# Patient Record
Sex: Female | Born: 1960 | Hispanic: No | Marital: Single | Smoking: Never smoker
Health system: Southern US, Community
[De-identification: ages and names within clinical notes are randomized; demographics above are authoritative.]

## PROBLEM LIST (undated history)

## (undated) DIAGNOSIS — K219 Gastro-esophageal reflux disease without esophagitis: Secondary | ICD-10-CM

## (undated) DIAGNOSIS — F329 Major depressive disorder, single episode, unspecified: Secondary | ICD-10-CM

## (undated) DIAGNOSIS — I10 Essential (primary) hypertension: Secondary | ICD-10-CM

## (undated) DIAGNOSIS — N289 Disorder of kidney and ureter, unspecified: Secondary | ICD-10-CM

## (undated) DIAGNOSIS — E785 Hyperlipidemia, unspecified: Secondary | ICD-10-CM

## (undated) DIAGNOSIS — N200 Calculus of kidney: Secondary | ICD-10-CM

## (undated) DIAGNOSIS — F32A Depression, unspecified: Secondary | ICD-10-CM

## (undated) DIAGNOSIS — F419 Anxiety disorder, unspecified: Secondary | ICD-10-CM

## (undated) DIAGNOSIS — F840 Autistic disorder: Secondary | ICD-10-CM

## (undated) DIAGNOSIS — R011 Cardiac murmur, unspecified: Secondary | ICD-10-CM

## (undated) HISTORY — DX: Essential (primary) hypertension: I10

## (undated) HISTORY — DX: Calculus of kidney: N20.0

## (undated) HISTORY — PX: NO PAST SURGERIES: SHX2092

## (undated) HISTORY — DX: Major depressive disorder, single episode, unspecified: F32.9

## (undated) HISTORY — DX: Anxiety disorder, unspecified: F41.9

## (undated) HISTORY — DX: Depression, unspecified: F32.A

## (undated) HISTORY — DX: Autistic disorder: F84.0

## (undated) HISTORY — DX: Hyperlipidemia, unspecified: E78.5

## (undated) HISTORY — DX: Cardiac murmur, unspecified: R01.1

## (undated) HISTORY — DX: Gastro-esophageal reflux disease without esophagitis: K21.9

---

## 2010-10-02 ENCOUNTER — Emergency Department (HOSPITAL_COMMUNITY)
Admission: EM | Admit: 2010-10-02 | Discharge: 2010-10-02 | Disposition: A | Discharge status: Home / Self Care | Payer: Managed Care, Other (non HMO) | Attending: Emergency Medicine | Admitting: Emergency Medicine

## 2010-10-02 ENCOUNTER — Emergency Department (HOSPITAL_COMMUNITY): Payer: Managed Care, Other (non HMO)

## 2010-10-02 DIAGNOSIS — R509 Fever, unspecified: Secondary | ICD-10-CM | POA: Insufficient documentation

## 2010-10-02 DIAGNOSIS — R0989 Other specified symptoms and signs involving the circulatory and respiratory systems: Secondary | ICD-10-CM | POA: Insufficient documentation

## 2010-10-02 DIAGNOSIS — R109 Unspecified abdominal pain: Secondary | ICD-10-CM | POA: Insufficient documentation

## 2010-10-02 DIAGNOSIS — R51 Headache: Secondary | ICD-10-CM | POA: Insufficient documentation

## 2010-10-02 DIAGNOSIS — IMO0001 Reserved for inherently not codable concepts without codable children: Secondary | ICD-10-CM | POA: Insufficient documentation

## 2010-10-02 DIAGNOSIS — R059 Cough, unspecified: Secondary | ICD-10-CM | POA: Insufficient documentation

## 2010-10-02 DIAGNOSIS — R110 Nausea: Secondary | ICD-10-CM | POA: Insufficient documentation

## 2010-10-02 DIAGNOSIS — R05 Cough: Secondary | ICD-10-CM | POA: Insufficient documentation

## 2010-10-02 DIAGNOSIS — R5381 Other malaise: Secondary | ICD-10-CM | POA: Insufficient documentation

## 2010-10-02 LAB — URINALYSIS, ROUTINE W REFLEX MICROSCOPIC
Glucose, UA: NEGATIVE mg/dL
Leukocytes, UA: NEGATIVE
Protein, ur: NEGATIVE mg/dL
pH: 6 (ref 5.0–8.0)

## 2010-10-02 LAB — COMPREHENSIVE METABOLIC PANEL
BUN: 14 mg/dL (ref 6–23)
Calcium: 9.5 mg/dL (ref 8.4–10.5)
Creatinine, Ser: 0.66 mg/dL (ref 0.4–1.2)
Glucose, Bld: 76 mg/dL (ref 70–99)
Total Protein: 6.7 g/dL (ref 6.0–8.3)

## 2010-10-02 LAB — POCT PREGNANCY, URINE: Preg Test, Ur: NEGATIVE

## 2010-10-02 LAB — DIFFERENTIAL
Basophils Relative: 1 % (ref 0–1)
Eosinophils Absolute: 0.1 10*3/uL (ref 0.0–0.7)
Lymphs Abs: 2 10*3/uL (ref 0.7–4.0)
Neutro Abs: 5.3 10*3/uL (ref 1.7–7.7)
Neutrophils Relative %: 67 % (ref 43–77)

## 2010-10-02 LAB — CBC
MCV: 88 fL (ref 78.0–100.0)
Platelets: 170 10*3/uL (ref 150–400)
RBC: 4.65 MIL/uL (ref 3.87–5.11)
WBC: 8 10*3/uL (ref 4.0–10.5)

## 2010-10-02 LAB — URINE MICROSCOPIC-ADD ON

## 2010-10-02 LAB — CK TOTAL AND CKMB (NOT AT ARMC)
CK, MB: 1.2 ng/mL (ref 0.3–4.0)
Total CK: 55 U/L (ref 7–177)

## 2010-10-24 ENCOUNTER — Inpatient Hospital Stay (HOSPITAL_COMMUNITY)
Admission: EM | Admit: 2010-10-24 | Discharge: 2010-10-26 | DRG: 918 | Disposition: A | Discharge status: Other Acute Inpatient Hospital | Payer: Managed Care, Other (non HMO) | Attending: Internal Medicine | Admitting: Internal Medicine

## 2010-10-24 ENCOUNTER — Emergency Department (HOSPITAL_COMMUNITY): Payer: Managed Care, Other (non HMO)

## 2010-10-24 DIAGNOSIS — E876 Hypokalemia: Secondary | ICD-10-CM | POA: Diagnosis present

## 2010-10-24 DIAGNOSIS — F339 Major depressive disorder, recurrent, unspecified: Secondary | ICD-10-CM | POA: Diagnosis present

## 2010-10-24 DIAGNOSIS — T50992A Poisoning by other drugs, medicaments and biological substances, intentional self-harm, initial encounter: Secondary | ICD-10-CM | POA: Diagnosis present

## 2010-10-24 DIAGNOSIS — R569 Unspecified convulsions: Secondary | ICD-10-CM | POA: Diagnosis present

## 2010-10-24 DIAGNOSIS — F411 Generalized anxiety disorder: Secondary | ICD-10-CM | POA: Diagnosis present

## 2010-10-24 DIAGNOSIS — T450X4A Poisoning by antiallergic and antiemetic drugs, undetermined, initial encounter: Principal | ICD-10-CM | POA: Diagnosis present

## 2010-10-24 DIAGNOSIS — Z818 Family history of other mental and behavioral disorders: Secondary | ICD-10-CM

## 2010-10-24 DIAGNOSIS — R42 Dizziness and giddiness: Secondary | ICD-10-CM | POA: Diagnosis present

## 2010-10-24 DIAGNOSIS — T394X2A Poisoning by antirheumatics, not elsewhere classified, intentional self-harm, initial encounter: Secondary | ICD-10-CM | POA: Diagnosis present

## 2010-10-24 DIAGNOSIS — T39314A Poisoning by propionic acid derivatives, undetermined, initial encounter: Secondary | ICD-10-CM | POA: Diagnosis present

## 2010-10-24 DIAGNOSIS — R000 Tachycardia, unspecified: Secondary | ICD-10-CM | POA: Diagnosis present

## 2010-10-24 LAB — DIFFERENTIAL
Basophils Relative: 1 % (ref 0–1)
Lymphs Abs: 1.8 10*3/uL (ref 0.7–4.0)
Monocytes Absolute: 0.5 10*3/uL (ref 0.1–1.0)
Monocytes Relative: 6 % (ref 3–12)
Neutro Abs: 5.3 10*3/uL (ref 1.7–7.7)

## 2010-10-24 LAB — CBC
HCT: 39.2 % (ref 36.0–46.0)
Hemoglobin: 13.8 g/dL (ref 12.0–15.0)
MCH: 30.9 pg (ref 26.0–34.0)
MCHC: 35.2 g/dL (ref 30.0–36.0)
MCV: 87.7 fL (ref 78.0–100.0)

## 2010-10-24 LAB — URINALYSIS, ROUTINE W REFLEX MICROSCOPIC
Bilirubin Urine: NEGATIVE
Hgb urine dipstick: NEGATIVE
Specific Gravity, Urine: 1.02 (ref 1.005–1.030)
Urobilinogen, UA: 0.2 mg/dL (ref 0.0–1.0)

## 2010-10-25 ENCOUNTER — Inpatient Hospital Stay (HOSPITAL_COMMUNITY): Payer: Managed Care, Other (non HMO)

## 2010-10-25 LAB — TSH: TSH: 2.61 u[IU]/mL (ref 0.350–4.500)

## 2010-10-25 LAB — COMPREHENSIVE METABOLIC PANEL
BUN: 12 mg/dL (ref 6–23)
CO2: 21 mEq/L (ref 19–32)
Chloride: 102 mEq/L (ref 96–112)
Creatinine, Ser: 0.82 mg/dL (ref 0.50–1.10)
GFR calc Af Amer: >60 mL/min (ref >60)
GFR calc non Af Amer: >60 mL/min (ref >60)
Glucose, Bld: 138 mg/dL — ABNORMAL HIGH (ref 70–99)
Total Bilirubin: 0.5 mg/dL (ref 0.3–1.2)

## 2010-10-25 LAB — BASIC METABOLIC PANEL
CO2: 20 mEq/L (ref 19–32)
Calcium: 9.3 mg/dL (ref 8.4–10.5)
Chloride: 106 mEq/L (ref 96–112)
GFR calc Af Amer: >60 mL/min (ref >60)
Sodium: 141 mEq/L (ref 135–145)

## 2010-10-25 LAB — IRON AND TIBC
Iron: 129 ug/dL (ref 42–135)
Saturation Ratios: 37 % (ref 20–55)
TIBC: 347 ug/dL (ref 250–470)
UIBC: 218 ug/dL

## 2010-10-25 LAB — MRSA PCR SCREENING: MRSA by PCR: NEGATIVE

## 2010-10-25 LAB — AMMONIA: Ammonia: 12 umol/L (ref 11–60)

## 2010-10-25 LAB — PROLACTIN: Prolactin: 5.6 ng/mL

## 2010-10-25 LAB — SALICYLATE LEVEL: Salicylate Lvl: <2 mg/dL — ABNORMAL LOW (ref 2.8–20.0)

## 2010-10-25 LAB — ETHANOL: Alcohol, Ethyl (B): <11 mg/dL (ref 0–11)

## 2010-10-25 LAB — RAPID URINE DRUG SCREEN, HOSP PERFORMED: Barbiturates: NOT DETECTED

## 2010-10-25 LAB — MAGNESIUM: Magnesium: 2.1 mg/dL (ref 1.5–2.5)

## 2010-10-26 ENCOUNTER — Inpatient Hospital Stay (HOSPITAL_COMMUNITY)
Admission: AD | Admit: 2010-10-26 | Discharge: 2010-10-30 | DRG: 885 | Disposition: A | Discharge status: Home / Self Care | Payer: Managed Care, Other (non HMO) | Source: Ambulatory Visit | Attending: Psychiatry | Admitting: Psychiatry

## 2010-10-26 DIAGNOSIS — F331 Major depressive disorder, recurrent, moderate: Secondary | ICD-10-CM

## 2010-10-26 DIAGNOSIS — T50992A Poisoning by other drugs, medicaments and biological substances, intentional self-harm, initial encounter: Secondary | ICD-10-CM

## 2010-10-26 DIAGNOSIS — R42 Dizziness and giddiness: Secondary | ICD-10-CM

## 2010-10-26 DIAGNOSIS — T450X4A Poisoning by antiallergic and antiemetic drugs, undetermined, initial encounter: Secondary | ICD-10-CM

## 2010-10-26 DIAGNOSIS — F411 Generalized anxiety disorder: Secondary | ICD-10-CM

## 2010-10-26 NOTE — Procedures (Signed)
EEG NUMBER:  REFERRING PHYSICIAN:  Dr. Thedore Mins.  HISTORY:  A 50 year old female with possible seizures and altered mental status.  MEDICATIONS:  Potassium, Ativan, meclizine.  CONDITIONS OF RECORDING:  This is a 16-channel EEG carried out patient in the awake state.  DESCRIPTION:  The waking background activity consists of a low-voltage symmetrical fairly well-organized 9-10 Hz alpha activity seen from the parieto-occipital and posterotemporal regions.  Low-voltage fast activity poorly organized was seen anteriorly at times, superimposed on more posterior rhythms.  A mixture of theta and alpha rhythms was seen from these central and temporal regions.  The patient does not drowse or sleep.  Hyperventilation was not performed.  Intermittent photic stimulation failed to elicit any change in the tracing.  IMPRESSION:  This is a normal awake EEG.  COMMENT:  An EEG, the patient is sleep deprived to elicit drowsing like sleep may be desirable to further elicit possible seizure disorder.    ______________________________ Thana Farr, MD     Dr. Singh    JY:NWGN D:  10/25/2010 20:01:43  T:  10/26/2010 56:21:30  Job #:  [id number]

## 2010-10-27 DIAGNOSIS — F339 Major depressive disorder, recurrent, unspecified: Secondary | ICD-10-CM

## 2010-10-27 DIAGNOSIS — F411 Generalized anxiety disorder: Secondary | ICD-10-CM

## 2010-10-28 NOTE — H&P (Signed)
[name], BRASINGTON                ACCOUNT NO.:  [id number]  MEDICAL RECORD NO.:  [id number]  LOCATION:                                FACILITY:  [facility]  PHYSICIAN:  Franchot Gallo, MD     DATE OF BIRTH:  [birth year]  DATE OF ADMISSION:  10/26/2010 DATE OF DISCHARGE:                      PSYCHIATRIC ADMISSION ASSESSMENT   CHIEF COMPLAINT:  "I tried to kill myself with over-the-counter sleep medications."  HISTORY OF PRESENT ILLNESS:  The patient is a 50 year old divorced white female who was admitted to [facility] on October 26, 2010 after overdosing on over-the-counter sleep medications.  The patient states that she has been feeling depressed and suicidal for the last 3-6 months and after a particularly hard day at work she purchased the over-the- counter medications with plan to overdose.  She reports that she overdosed on the medication and then was taken to [facility] for treatment and later transferred to [facility].  The patient states she has had depressive symptoms for many years and first attempted to harm herself when she was 50 years of age again by overdosing on medications.  The patient reports that she has difficulty with "meeting people" at work as well as a very critical mother.  She reports that she recently relocated back to [place] from [place] where the patient resided for 9 years.  She states that while in [place], she felt her depressive symptoms were under better control. Since returning to [place] 1 year ago, she reports increasing depressive symptoms.  Currently, the patient reports sleeping well without difficulty but reports a decreased appetite.  She describes her depression as severe and states that on a scale of 1-10 she would rate it a 10.  She reports severe feelings of sadness, anhedonia and depressed mood that again has been present for the last 6 months.  She currently denies any suicidal ideations with plan or intent,  but states that she continues to have some vague suicidal ideations.  The patient denies any past or current auditory or visual hallucinations or delusional thinking.  She also denies any past or current manic or hypomanic symptoms. The patient does state that she has significant anxiety throughout the day and describes it as "fear of the unknown."  She denies any panic episodes.  She also denies any substance abuse related issues.  The patient is admitted for evaluation and treatment of the above symptoms.  PAST PSYCHIATRIC HISTORY:  The patient states that she has one past psychiatric hospitalization to [facility] approximately 20 years ago.  She states that she has been on numerous antidepressive medications including Prozac, Zoloft, Paxil, Effexor, Wellbutrin SR, Lexapro and Cymbalta as well as Depakote, Lamictal and Abilify.  She denies any past use of Celexa, but states that all antidepressants in the past have "made me a zombie."  Currently, she is not under any psychiatric care.  PAST MEDICAL HISTORY:  CURRENT MEDICATIONS:   1. Zyrtec 10 mg p.o. daily.  ALLERGIES:   1. Sulfa medications, results in nausea and vomiting and GI upset.  MEDICAL HISTORY: 1. Seasonal allergies. 2. Vertigo.  PAST OPERATIONS:  None reported.  FAMILY HISTORY:  The patient's mother reportedly  has a history of depression and anxiety.  She denies any other family history of psychiatric or substance related illnesses.  SOCIAL HISTORY:  The patient was born and raised in [place], [place] and recently relocated back to [place] after living in [place] for 9 years.  The patient is divorced after 7 years of marriage with a divorce ending in 2006.  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  She reports that prior to returning to [place], she worked at [employer] of [place] for 9 years.  The patient currently lives with her parents in [place].  She denies any use of tobacco products, alcohol or illicit drugs.  MENTAL STATUS EXAM:  General:  The patient was alert and oriented x3 and was cooperative with this provider, but somewhat guarded in her answers. The patient had some religious overtime such as asked if she had ever been diagnosed with OCD.  She stated "my bible and my lord never called me OCD."  Speech was appropriate in terms of rate and volume.  Mood appeared moderate to severely depressed.  Affect was moderate to severely constricted.  Thoughts:  The patient denied any auditory or visual hallucinations or delusional thinking.  She reported some vague suicidal ideations but no plan or intent.  She denied any homicidal ideations.  Judgment and insight both appeared fair.  IMPRESSION:   AXIS I: 1. Major depressive disorder, recurrent, severe. 2. Generalized anxiety disorder, prep currently under poor control. AXIS II:  Deferred. AXIS III:  Please see medical history above. AXIS IV:  Difficulty with primary support system.  Occupational difficulties.  Longstanding chronic mental illness. AXIS V:  GAF at time of admission approximately 40.  Highest GAF in the past year approximately 60.  PLAN: 1. After much discussion, the patient was agreeable to starting the     medication Celexa at 20 mg p.o. q.a.m. to begin to address her     depressive and anxiety symptoms. 2. Efforts will be made to continue to discuss medications with the     patient in order to address her symptoms. 3. The patient will continue to be monitored for dangerousness to self     and/or others. 4. The patient will participate in group and unit activities per     routine.    _________________________________ Franchot Gallo, MD     RR/MEDQ  D:  10/27/2010  T:  10/28/2010  Job:  [id number]  Electronically Signed by Franchot Gallo MD on 10/28/2010  05:34:17 PM

## 2010-10-30 DIAGNOSIS — F411 Generalized anxiety disorder: Secondary | ICD-10-CM

## 2010-10-30 DIAGNOSIS — F339 Major depressive disorder, recurrent, unspecified: Secondary | ICD-10-CM

## 2010-11-02 NOTE — Discharge Summary (Signed)
[name], [name] NO.:  [id number]  MEDICAL RECORD NO.:  [id number]  LOCATION:  0502                          FACILITY:  [facility]  PHYSICIAN:  Franchot Gallo, MD     DATE OF BIRTH:  [birth year]  DATE OF ADMISSION:  10/26/2010 DATE OF DISCHARGE:  10/30/2010                              DISCHARGE SUMMARY   [name] is a 50 year old divorced white female who was admitted to [facility] on October 26, 2010, after overdosing on over-the-counter medications.  The patient stated that she was feeling depressed and suicidal for at least 3-6 months prior to admission secondary to work- related stresses.  The patient stated on a particularly day at work, she purchased over-the-counter medications with plans to overdose.  After taking the medications, she was taken to [facility] treatment and later transferred to [facility].  She was seen by this physician for the initial evaluation on October 28, 2010.  SUMMARY OF HOSPITALIZATION:  As stated above, the patient was first seen by this physician on October 27, 2010, and reported that she was sleeping well but reported a decreased appetite.  She, however, reported severe feelings of sadness, anhedonia and depressed mood and rated her depression on a scale of 1-10 as a 10.  She denied any suicidal or homicidal ideations.  She also denied any auditory or visual hallucinations or delusional thinking.  She rated her anxiety as severe and denied any substance abuse-related issues.  The patient was started on the medication Celexa at 20 mg p.o. q.a.m. to address her depressive and anxiety symptoms.  The patient, however, was reluctant to start medications but did agree to this medication.  The patient was next seen by the on-call staff on October 28, 2010, and rated her depression on a scale of 1-10 as a 6.  She stated she slept okay.  She denied suicidal or homicidal ideations.  The patient was next seen by the  on-call treatment team on October 29, 2010, and stated that she did not feel that there were enough structural activities at [facility].  She also stated that she was planning to return to work the following Monday.  She reported she was tolerating the medication Celexa and denied any suicidal or homicidal ideations as well as any auditory or visual hallucinations or delusional thinking.  The patient was next seen by the on-call team on October 30, 2010, and stated she was ready to leave.  She stated she did not want one more meal here, and that food was "going right through her."  She denied any auditory or visual hallucinations or delusional thinking and also denied any suicidal or homicidal ideations.  The patient was seen by Dr. Geoffery Lyons, who was the on-call provider, and was assessed for suicide and was found to be suitable for discharge.  The patient was discharged on October 30, 2010.  SIGNIFICANT LABORATORIES:  The patient, on October 24, 2010, was found to have a potassium of 3.2.  Repeat of this on October 25, 2010, showed a potassium of 3.8.  DISCHARGE MEDICATIONS: 1. Celexa 20 mg p.o. q.a.m. 2. Zyrtec 10 mg p.o. q.h.s.  DISCHARGE  DIAGNOSES:  Axis I: 1. Major depressive disorder - Recurrent - Moderate. 2. Generalized anxiety disorder - Under fair to good control. Axis II:  Deferred. Axis III: 1. SEASONAL ALLERGIES. 2. Vertigo. Axis IV:  Difficulty with primary support system.  Occupational difficulties.  Longstanding chronic mental illness. Axis V:  Global Assessment of Functioning at time of admission approximately 40.  Estimated Global Assessment of Functioning at time of discharge approximately 65.  (This is an estimate, since this provider was not present at the time of the patient's discharge.)  CONDITION AT DISCHARGE:  According to the suicide assessment completed by Dr. Geoffery Lyons at the time of discharge, the patient was endorsing no active suicidal ideations, no  plans and no intent.  She will be taking her medication, and she will not isolate.  She plans to go to church.  FOLLOWUP INSTRUCTIONS:  The patient will follow up with [name], a nurse practitioner at [facility] on November 03, 2010, at 2:00 p.m.    __________________________________ Franchot Gallo, MD     RR/MEDQ  D:  11/01/2010  T:  11/01/2010  Job:  [id number]  Electronically Signed by Franchot Gallo MD on 11/02/2010 07:27:17 AM
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
## 2010-11-04 NOTE — Consult Note (Signed)
  [name], [name]                ACCOUNT NO.:  [id number]  MEDICAL RECORD NO.:  [id number]  LOCATION:  5523                         FACILITY:  [facility]  PHYSICIAN:  Eulogio Ditch, MD DATE OF BIRTH:  April 21, 1961  DATE OF CONSULTATION: DATE OF DISCHARGE:                                CONSULTATION   REASON FOR CONSULTATION:  Depression/overdose suicide attempt.  HISTORY OF PRESENT ILLNESS:  A 50 year old female who was admitted on the medical floor after the overdosed Advil approximately 30 pills to kill herself.  The patient reported that it all started at the work. 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  Her mother also hates her and that is why she was thinking of killing herself for many days to weeks and then yesterday she acted on that.  The patient has still reported depressed mood and have suicidal ideations.  The patient reported that mother hate her in the work place that is why she is becoming more depressed "one person can take so much."  The patient reported that her sleep is fair, but appetite comes and goes.  She denied hearing any voices or any paranoid behavior.  The patient was divorced in 2006.  She was married for 7 years.  She reported that they were not getting along, so that is why she was divorced.  The patient has no kids.  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  PAST PSYCHIATRIC HISTORY:  The patient has a history of suicide attempt at the age of 42.  She was admitted in the hospital, but she did not any counseling or therapy after that and then around the age of 11, she was started on number of medications for depression, but she reported that none of them has worked for her, but she is not taking any medication.  PAST MEDICAL HISTORY:  No active medical issue.  ALLERGIES:  NONE.  MENTAL STATUS EXAMINATION:  The patient is calm and cooperative  during interview.  Fair eye contact.  Speech is soft and slow.  No psychomotor agitation or retardation noted.  Mood depressed.  Affect constricted, still have suicidal ideations, recent suicide attempt, not delusional, not internally preoccupied, and not hallucinating.  Cognition alert, awake, and oriented x3.  Memory immediate and recent remote fair. Attention and concentration fair.  Abstraction ability fair.  Insight and judgment poor.  DIAGNOSES:  Axis I:  Major depressive disorder, recurrent type; anxiety disorder, not otherwise specified. Axis II:  Deferred. Axis III:  See medical notes. Axis IV:  Stress both at home and at job. Axis V:  30.  RECOMMENDATIONS:  Once the patient is medically cleared, the patient will be transferred to [facility] for further stabilization.  No medication started at this time, sitter can be continued.     Eulogio Ditch, MD     SA/MEDQ  D:  10/26/2010  T:  10/26/2010  Job:  [id number]  Electronically Signed by Eulogio Ditch  on 11/04/2010 07:31:22 AM

## 2010-12-13 NOTE — Discharge Summary (Signed)
[name], Turner NO.:  [id number]  MEDICAL RECORD NO.:  [id number]  LOCATION:  5523                         FACILITY:  [facility]  PHYSICIAN:  Altha Harm, MDDATE OF BIRTH:  1961-04-04  DATE OF ADMISSION:  10/24/2010 DATE OF DISCHARGE:  10/26/2010                        DISCHARGE SUMMARY - REFERRING   PRIMARY CARE PHYSICIAN:  Deatra James, MD of [facility] at [address]  DISCHARGE DIAGNOSES: 1. Suicide attempt, sleeping pill overdose. 2. History of depression, anxiety. 3. Question of tonic-clonic seizure episode en route to the hospital. 4. Hypokalemia. 5. Vestibular nerve hypofunction causing vertigo.  CONDITION AT THE TIME OF DISCHARGE:  The patient is alert and oriented. She is calm and pleasant.  She is not looking forward to going to [facility], but willing to do so if it improves her life. Otherwise, the patient is eating well and drinking well, able to ambulate and take care for all of her own ADLs.  HISTORY AND BRIEF HOSPITAL COURSE:  [name] is an extremely pleasant 50 year old female who has lived with her mother for the past year and a half after stressful divorce.  The patient tells me that at work she was having a difficult situation with her coworkers and that they bullied her as did her employer.  Also, she was having difficulty with her mother.  The patient consumed a bottle of [employer] generic sleeping pills and there were 32 in the bottle in an attempt to end her life.  She was taken to the emergency department where she was found to have very dry mouth, tachycardia, and hypertension.  There was a question of the patient having a tonic-clonic seizure in the ambulance on the way to the hospital.  Her initial EKG, CT of her brain and labs were unremarkable.  Urine drug screen was negative.  Acetaminophen and salicylate levels were negative.  EEG was done on October 25, 2010, and was found to be a normal awake  EEG.  The patient was able to be transferred from the [facility] Unit to the floor today.  She is doing well.  She has been seen by Psychiatry who feels that she would benefit from an inpatient hospital stay.  Consequently, psychiatric social work was able to obtain a bed for her at [facility] and she will be transferred this afternoon.  PHYSICAL EXAMINATION:  GENERAL:  At the time of discharge, the patient is awake, alert in no apparent distress.  As mentioned, she is not particularly happy about going to the [facility], but is willing to do so. VITAL SIGNS:  Temperature 98.8, pulse 64, respirations 18, blood pressure 121/75, O2 sats 95% on room air. HEENT:  Head is atraumatic, normocephalic.  Eyes are anicteric with pupils are equal, round, reactive to light.  Nose shows no nasal discharge or exterior lesions.  Mouth has moist mucous membranes and good dentition. NECK:  Supple, midline trachea.  No JVD.  No lymphadenopathy. CHEST:  Demonstrates no accessory muscle use.  She has no wheezes or crackles to my exam. HEART:  Regular rate and rhythm.  No obvious murmurs, rubs, or gallops. ABDOMEN:  Soft, nontender, nondistended with  good bowel sounds. EXTREMITIES:  Show no clubbing, cyanosis, or edema. SKIN:  Shows no rashes, bruises, or lesions. NEURO:  Cranial nerves II through XII are grossly intact.  She has no facial asymmetries.  No obvious focal neuro deficits.    On exam and working with physical therapy, the patient appears to have a hypofunction of her vestibular nerve.  The patient is unable to move her head to the left or right and yet maintain to focus on a stationary object.  Physical therapy as working with her now regarding this.  LABORATORY DATA:  CBC, October 24, 2010, white count 7.6, hemoglobin 13.8, hematocrit 39.2, platelets 153.  BMET on October 25, 2010, sodium 141, potassium 3.8, chloride 106, bicarb 20, glucose 79, BUN 7, creatinine 0.64,  calcium 9.3, magnesium 2.1, she had a TSH 2.610.  Iron 129, TIBC 347%, sat 37%, UIBC 218, vitamin B12 of 305.  Folate 10.2, ferritin 22. Urine pregnancy was negative.  Serum prolactin was 5.6.  Acetaminophen level less than 15.  Salicylate level less than 2.  Drug screen was negative.  Alcohol level less than 11.  Urinary analysis was negative. Nasal swab for MRSA by PCR was negative.  Radiological exams include a CT of her head without contrast that was negative.  A normal study and a two-view of her chest that was negative, normal study.  DISCHARGE MEDICATIONS: 1. Meclizine 12.5 mg by mouth every 6 hours as needed for dizziness or     vertigo. 2. Zofran 4 mg 1 tablet by mouth every 6 hours as needed for nausea.  DISCHARGE INSTRUCTIONS:  The patient will be leaving [facility] for [facility].  Activity will be as tolerated.  Diet will be regular diet.  FOLLOWUP INSTRUCTIONS:  The patient should see Dr. Deatra James in 1 to 2 weeks after discharge from [facility] to follow up on her hypokalemia, her vestibular nerve hypofunction and her depression and anxiety as necessary.   Stephani Police, PA   ______________________________ Altha Harm, MD    MLY/MEDQ  D:  10/26/2010  T:  10/26/2010  Job:  [id number]  cc:   Deatra James, M.D.  Electronically Signed by Algis Downs PA on 11/15/2010 08:34:15 PM Electronically Signed by Marthann Schiller MD on 12/13/2010 08:41:09 PM

## 2012-08-23 ENCOUNTER — Emergency Department (HOSPITAL_COMMUNITY): Payer: Self-pay

## 2012-08-23 ENCOUNTER — Encounter (HOSPITAL_COMMUNITY): Payer: Self-pay | Admitting: Emergency Medicine

## 2012-08-23 ENCOUNTER — Emergency Department (HOSPITAL_COMMUNITY)
Admission: EM | Admit: 2012-08-23 | Discharge: 2012-08-23 | Disposition: A | Discharge status: Home / Self Care | Payer: Self-pay | Attending: Emergency Medicine | Admitting: Emergency Medicine

## 2012-08-23 DIAGNOSIS — R110 Nausea: Secondary | ICD-10-CM | POA: Insufficient documentation

## 2012-08-23 DIAGNOSIS — Z87448 Personal history of other diseases of urinary system: Secondary | ICD-10-CM | POA: Insufficient documentation

## 2012-08-23 DIAGNOSIS — N201 Calculus of ureter: Secondary | ICD-10-CM | POA: Insufficient documentation

## 2012-08-23 DIAGNOSIS — N200 Calculus of kidney: Secondary | ICD-10-CM | POA: Insufficient documentation

## 2012-08-23 DIAGNOSIS — R6883 Chills (without fever): Secondary | ICD-10-CM | POA: Insufficient documentation

## 2012-08-23 HISTORY — DX: Disorder of kidney and ureter, unspecified: N28.9

## 2012-08-23 LAB — URINALYSIS, MICROSCOPIC ONLY
Glucose, UA: NEGATIVE mg/dL
Ketones, ur: NEGATIVE mg/dL
pH: 6 (ref 5.0–8.0)

## 2012-08-23 MED ORDER — TAMSULOSIN HCL 0.4 MG PO CAPS: 0.4000 mg | ORAL_CAPSULE | Freq: Every day | ORAL | Status: DC

## 2012-08-23 MED ORDER — ONDANSETRON 4 MG PO TBDP
4.0000 mg | ORAL_TABLET | Freq: Once | ORAL | Status: AC
  Administered 2012-08-23: 4 mg via ORAL
  Filled 2012-08-23: qty 1

## 2012-08-23 MED ORDER — OXYCODONE-ACETAMINOPHEN 5-325 MG PO TABS
2.0000 | ORAL_TABLET | Freq: Once | ORAL | Status: AC
  Administered 2012-08-23: 2 via ORAL
  Filled 2012-08-23: qty 2

## 2012-08-23 MED ORDER — ONDANSETRON HCL 4 MG/2ML IJ SOLN
4.0000 mg | Freq: Once | INTRAMUSCULAR | Status: AC
  Administered 2012-08-23: 4 mg via INTRAVENOUS
  Filled 2012-08-23: qty 2

## 2012-08-23 MED ORDER — OXYCODONE-ACETAMINOPHEN 5-325 MG PO TABS: 1.0000 | ORAL_TABLET | Freq: Four times a day (QID) | ORAL | Status: DC | PRN

## 2012-08-23 MED ORDER — ONDANSETRON HCL 4 MG PO TABS: 4.0000 mg | ORAL_TABLET | Freq: Four times a day (QID) | ORAL | Status: DC

## 2012-08-23 NOTE — ED Provider Notes (Signed)
History     CSN: [id number]  Arrival date & time 08/23/12  1810   First MD Initiated Contact with Patient 08/23/12 1829      Chief Complaint  Patient presents with  . Flank Pain    (Consider location/radiation/quality/duration/timing/severity/associated sxs/prior treatment) HPI Comments: 52 year old female with past medical history of kidney stones presents to the emergency department complaining of sudden onset left-sided flank pain beginning 3 hours ago after urinating. Initial pain described as sharp and severe, rated 10 out of 10, however it is been waxing and waning since and currently rated 6/10. She try taking Aleve without much relief. Admits to associated chills and nausea without vomiting or fever. Denies difficulty urinating, increased urinary frequency, urgency, dysuria or hematuria. Last kidney stone was over 15 years ago. Denies abdominal pain, chest pain, sob.  Patient is a 52 y.o. female presenting with flank pain. The history is provided by the patient.  Flank Pain Associated symptoms include chills and nausea. Pertinent negatives include no abdominal pain, fever or vomiting.    Past Medical History  Diagnosis Date  . Renal disorder     History reviewed. No pertinent past surgical history.  No family history on file.  History  Substance Use Topics  . Smoking status: Never Smoker   . Smokeless tobacco: Not on file  . Alcohol Use: No    OB History   Grav Para Term Preterm Abortions TAB SAB Ect Mult Living                  Review of Systems  Constitutional: Positive for chills. Negative for fever.  Gastrointestinal: Positive for nausea. Negative for vomiting and abdominal pain.  Genitourinary: Positive for flank pain. Negative for dysuria, urgency, frequency, hematuria and difficulty urinating.  All other systems reviewed and are negative.    Allergies  Sodium feredetate and Sulfur  Home Medications   Current Outpatient Rx  Name  Route  Sig   Dispense  Refill  . acetaminophen (TYLENOL) 500 MG tablet   Oral   Take 500 mg by mouth every 6 (six) hours as needed for pain.         . naproxen sodium (ANAPROX) 220 MG tablet   Oral   Take 220 mg by mouth daily as needed (as needed for pain).         . vitamin C (ASCORBIC ACID) 500 MG tablet   Oral   Take 500 mg by mouth daily.           BP 157/74  Pulse 53  Temp(Src) 98.7 F (37.1 C) (Oral)  Resp 18  SpO2 100%  Physical Exam  Nursing note and vitals reviewed. Constitutional: She is oriented to person, place, and time. She appears well-developed and well-nourished.  Standing in exam room, uncomfortable sitting.  HENT:  Head: Normocephalic and atraumatic.  Mouth/Throat: Oropharynx is clear and moist.  Eyes: Conjunctivae are normal.  Neck: Normal range of motion. Neck supple.  Cardiovascular: Normal rate, regular rhythm, normal heart sounds and intact distal pulses.   Pulmonary/Chest: Effort normal and breath sounds normal. No respiratory distress.  Abdominal: Soft. Bowel sounds are normal. She exhibits no distension. There is tenderness. There is no rigidity, no rebound, no guarding and no CVA tenderness.    Musculoskeletal: Normal range of motion. She exhibits no edema.  Neurological: She is alert and oriented to person, place, and time.  Skin: Skin is warm and dry. She is not diaphoretic.  Psychiatric: She has a normal  mood and affect. Her behavior is normal.    ED Course  Procedures (including critical care time)  Labs Reviewed  URINALYSIS, MICROSCOPIC ONLY   Ct Abdomen Pelvis Wo Contrast  08/23/2012  *RADIOLOGY REPORT*  Clinical Data: 52 year old female with left flank, abdominal and pelvic pain.  CT ABDOMEN AND PELVIS WITHOUT CONTRAST  Technique:  Multidetector CT imaging of the abdomen and pelvis was performed following the standard protocol without intravenous contrast.  Comparison: None  Findings: A 4 mm distal left ureteral calculus (1 cm above the  left UVJ) is identified causing mild to moderate left hydroureteronephrosis. A punctate nonobstructing mid left renal calculus is also noted.  The liver, right kidney, adrenal glands, gallbladder, pancreas and spleen are unremarkable. Please note that parenchymal abnormalities may be missed as intravenous contrast was not administered.  A trace amount of free fluid within the pelvis may be physiologic. There is no evidence of biliary dilatation, enlarged lymph nodes or abdominal aortic aneurysm.  The bowel and appendix are unremarkable.  No acute or suspicious bony abnormalities are identified.  IMPRESSION: 4 mm obstructing distal left ureteral calculus just above the left UVJ with mild to moderate left hydroureteronephrosis.  Punctate nonobstructing left renal calculus.   Original Report Authenticated By: Harmon Pier, M.D.      1. Ureteral stone   2. Kidney stone       MDM  L flank pain after urinating, similar to prior kidney stone. U/A, CT abdomen/pelvis without contrast, percocet for pain, zofran for nausea. 7:34 PM CT scan showing 4 mm obstructing distal left ureteral calculus above L UVJ with mild to moderate L hydroureteronephrosis. Punctate nonobstructing L renal calculus. 8:01 PM Patient resting comfortably in bed after receiving percocet and zofran. Pain greatly decreased. Urine without infection. Vitals stable. Stable for discharge.        Trevor Mace, PA-C 08/23/12 2001

## 2012-08-23 NOTE — ED Notes (Signed)
Pt presenting to ed with c/o left side flank pain with nausea, chills, pt states history of kidney stones pt denies hematuria at this time

## 2012-08-23 NOTE — ED Notes (Signed)
Patient is alert and oriented x3.  She was given DC instructions and follow up visit instructions.  Patient gave verbal understanding. She was DC ambulatory under her own power to home.  V/S stable.  Patient was demonstrating 10 of 10 pain due to kidney stone.

## 2012-08-23 NOTE — ED Notes (Signed)
Pt c/o L flank pain onset 1500 with emesis x 1. Pt does have hx of kidney stones. +

## 2012-08-23 NOTE — ED Notes (Signed)
Patient transported to CT 

## 2012-08-24 NOTE — ED Provider Notes (Signed)
Medical screening examination/treatment/procedure(s) were performed by non-physician practitioner and as supervising physician I was immediately available for consultation/collaboration.  Bethanee Redondo T Yvon Mccord, MD 08/24/12 2312 

## 2013-02-14 ENCOUNTER — Emergency Department (HOSPITAL_COMMUNITY)
Admission: EM | Admit: 2013-02-14 | Discharge: 2013-02-14 | Disposition: A | Discharge status: Home / Self Care | Payer: Managed Care, Other (non HMO) | Attending: Emergency Medicine | Admitting: Emergency Medicine

## 2013-02-14 ENCOUNTER — Encounter (HOSPITAL_COMMUNITY): Payer: Self-pay | Admitting: Emergency Medicine

## 2013-02-14 ENCOUNTER — Emergency Department (HOSPITAL_COMMUNITY): Payer: Managed Care, Other (non HMO)

## 2013-02-14 DIAGNOSIS — M25511 Pain in right shoulder: Secondary | ICD-10-CM

## 2013-02-14 DIAGNOSIS — Z87448 Personal history of other diseases of urinary system: Secondary | ICD-10-CM | POA: Insufficient documentation

## 2013-02-14 DIAGNOSIS — M791 Myalgia, unspecified site: Secondary | ICD-10-CM

## 2013-02-14 DIAGNOSIS — M542 Cervicalgia: Secondary | ICD-10-CM | POA: Insufficient documentation

## 2013-02-14 DIAGNOSIS — Z79899 Other long term (current) drug therapy: Secondary | ICD-10-CM | POA: Insufficient documentation

## 2013-02-14 DIAGNOSIS — M25519 Pain in unspecified shoulder: Secondary | ICD-10-CM | POA: Insufficient documentation

## 2013-02-14 DIAGNOSIS — R51 Headache: Secondary | ICD-10-CM | POA: Insufficient documentation

## 2013-02-14 MED ORDER — CYCLOBENZAPRINE HCL 10 MG PO TABS: 10.0000 mg | ORAL_TABLET | Freq: Two times a day (BID) | ORAL | Status: DC | PRN

## 2013-02-14 MED ORDER — NAPROXEN 500 MG PO TABS: 500.0000 mg | ORAL_TABLET | Freq: Two times a day (BID) | ORAL | Status: DC

## 2013-02-14 MED ORDER — IBUPROFEN 800 MG PO TABS
800.0000 mg | ORAL_TABLET | Freq: Once | ORAL | Status: AC
  Administered 2013-02-14: 800 mg via ORAL
  Filled 2013-02-14: qty 1

## 2013-02-14 NOTE — ED Notes (Signed)
Per pt has had shoulder pain x3 months. Pt reports that she "thought pain would go away" but pain has not resolved with use of motrin and ice. Pt reports pain radiates down entire arm and hurts worse with movement.
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
## 2013-02-14 NOTE — Progress Notes (Signed)
Med necesity form completed in error.  [name], [place] [phone]  ED CSW .02/14/2013 1648pm
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
## 2013-02-15 NOTE — ED Provider Notes (Signed)
Medical screening examination/treatment/procedure(s) were performed by non-physician practitioner and as supervising physician I was immediately available for consultation/collaboration.  Doug Sou, MD 02/15/13 [phone]

## 2013-05-29 ENCOUNTER — Encounter: Payer: Self-pay | Admitting: Internal Medicine

## 2013-05-29 ENCOUNTER — Ambulatory Visit: Payer: Managed Care, Other (non HMO) | Attending: Internal Medicine | Admitting: Internal Medicine

## 2013-05-29 VITALS — BP 143/81 | HR 59 | Temp 98.6°F | Resp 14 | Ht 63.0 in | Wt 121.0 lb

## 2013-05-29 DIAGNOSIS — Z23 Encounter for immunization: Secondary | ICD-10-CM

## 2013-05-29 DIAGNOSIS — Z0000 Encounter for general adult medical examination without abnormal findings: Secondary | ICD-10-CM

## 2013-05-29 DIAGNOSIS — I10 Essential (primary) hypertension: Secondary | ICD-10-CM | POA: Insufficient documentation

## 2013-05-29 DIAGNOSIS — F329 Major depressive disorder, single episode, unspecified: Secondary | ICD-10-CM

## 2013-05-29 DIAGNOSIS — M25519 Pain in unspecified shoulder: Secondary | ICD-10-CM | POA: Insufficient documentation

## 2013-05-29 DIAGNOSIS — F3289 Other specified depressive episodes: Secondary | ICD-10-CM | POA: Insufficient documentation

## 2013-05-29 DIAGNOSIS — F32A Depression, unspecified: Secondary | ICD-10-CM

## 2013-05-29 LAB — TSH: TSH: 1.873 u[IU]/mL (ref 0.350–4.500)

## 2013-05-29 LAB — CBC WITH DIFFERENTIAL/PLATELET
BASOS ABS: 0.1 10*3/uL (ref 0.0–0.1)
Basophils Relative: 1 % (ref 0–1)
Eosinophils Absolute: 0.1 10*3/uL (ref 0.0–0.7)
Eosinophils Relative: 2 % (ref 0–5)
HEMATOCRIT: 42.3 % (ref 36.0–46.0)
HEMOGLOBIN: 14.6 g/dL (ref 12.0–15.0)
LYMPHS PCT: 33 % (ref 12–46)
Lymphs Abs: 1.9 10*3/uL (ref 0.7–4.0)
MCH: 31.4 pg (ref 26.0–34.0)
MCHC: 34.5 g/dL (ref 30.0–36.0)
MCV: 91 fL (ref 78.0–100.0)
MONOS PCT: 9 % (ref 3–12)
Monocytes Absolute: 0.5 10*3/uL (ref 0.1–1.0)
Neutro Abs: 3.1 10*3/uL (ref 1.7–7.7)
Neutrophils Relative %: 55 % (ref 43–77)
Platelets: 226 10*3/uL (ref 150–400)
RBC: 4.65 MIL/uL (ref 3.87–5.11)
RDW: 13.2 % (ref 11.5–15.5)
WBC: 5.8 10*3/uL (ref 4.0–10.5)

## 2013-05-29 LAB — COMPLETE METABOLIC PANEL WITH GFR
ALBUMIN: 4.5 g/dL (ref 3.5–5.2)
ALT: 32 U/L (ref 0–35)
AST: 32 U/L (ref 0–37)
Alkaline Phosphatase: 80 U/L (ref 39–117)
BUN: 12 mg/dL (ref 6–23)
CO2: 33 mEq/L — ABNORMAL HIGH (ref 19–32)
Calcium: 9.7 mg/dL (ref 8.4–10.5)
Chloride: 98 mEq/L (ref 96–112)
Creat: 0.7 mg/dL (ref 0.50–1.10)
GFR, Est African American: >89 mL/min
GFR, Est Non African American: >89 mL/min
Glucose, Bld: 78 mg/dL (ref 70–99)
POTASSIUM: 4.7 meq/L (ref 3.5–5.3)
Sodium: 136 mEq/L (ref 135–145)
TOTAL PROTEIN: 6.9 g/dL (ref 6.0–8.3)
Total Bilirubin: 0.7 mg/dL (ref 0.2–1.2)

## 2013-05-29 LAB — LIPID PANEL
CHOL/HDL RATIO: 3.1 ratio
Cholesterol: 228 mg/dL — ABNORMAL HIGH (ref 0–200)
HDL: 73 mg/dL (ref >39)
LDL Cholesterol: 139 mg/dL — ABNORMAL HIGH (ref 0–99)
Triglycerides: 79 mg/dL (ref <150)
VLDL: 16 mg/dL (ref 0–40)

## 2013-05-29 LAB — POCT GLYCOSYLATED HEMOGLOBIN (HGB A1C): HEMOGLOBIN A1C: 5

## 2013-05-29 MED ORDER — OMEPRAZOLE 40 MG PO CPDR: 40.0000 mg | DELAYED_RELEASE_CAPSULE | Freq: Every day | ORAL | Status: DC

## 2013-05-29 MED ORDER — TRAMADOL HCL 50 MG PO TABS: 50.0000 mg | ORAL_TABLET | Freq: Two times a day (BID) | ORAL | Status: DC | PRN

## 2013-05-29 MED ORDER — CYCLOBENZAPRINE HCL 10 MG PO TABS: 10.0000 mg | ORAL_TABLET | Freq: Every day | ORAL | Status: DC

## 2013-05-29 NOTE — Progress Notes (Signed)
Pt here to establish care for right anterior/posterior arm with flexing and bending due to injury 02/14/13 Denies swelling No other medical hx noted BP- 143/81
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
## 2013-05-30 ENCOUNTER — Telehealth: Payer: Self-pay | Admitting: *Deleted

## 2013-05-30 NOTE — Telephone Encounter (Signed)
Left a voicemail for pt to give us a call back. 

## 2013-05-30 NOTE — Telephone Encounter (Signed)
Message copied by Cleburn Maiolo, [place] on Fri May 30, 2013  2:39 PM ------      Message from: Susie CassetteABROL MD, [facility]      Created: Fri May 30, 2013 11:08 AM       Notify patient of the labs are normal, mildly elevated LDL, can be corrected with exercise and low-fat diet ------

## 2013-06-03 ENCOUNTER — Telehealth: Payer: Self-pay | Admitting: Internal Medicine

## 2013-06-03 ENCOUNTER — Telehealth: Payer: Self-pay | Admitting: Emergency Medicine

## 2013-06-03 NOTE — Telephone Encounter (Signed)
Pt given lab results with instructions 

## 2013-06-03 NOTE — Telephone Encounter (Signed)
Pt calling returning call, for [place]. Please f/u for pt.

## 2013-06-03 NOTE — Telephone Encounter (Signed)
Pt given

## 2013-06-11 ENCOUNTER — Ambulatory Visit (HOSPITAL_COMMUNITY)
Admission: RE | Admit: 2013-06-11 | Discharge: 2013-06-11 | Disposition: A | Discharge status: Home / Self Care | Payer: Managed Care, Other (non HMO) | Source: Ambulatory Visit | Attending: Internal Medicine | Admitting: Internal Medicine

## 2013-06-11 DIAGNOSIS — F329 Major depressive disorder, single episode, unspecified: Secondary | ICD-10-CM

## 2013-06-11 DIAGNOSIS — I10 Essential (primary) hypertension: Secondary | ICD-10-CM

## 2013-06-11 DIAGNOSIS — F32A Depression, unspecified: Secondary | ICD-10-CM

## 2013-06-11 DIAGNOSIS — M25519 Pain in unspecified shoulder: Secondary | ICD-10-CM | POA: Insufficient documentation

## 2013-06-11 DIAGNOSIS — M25419 Effusion, unspecified shoulder: Secondary | ICD-10-CM | POA: Insufficient documentation

## 2013-06-13 ENCOUNTER — Encounter: Payer: Self-pay | Admitting: Family Medicine

## 2013-06-13 ENCOUNTER — Ambulatory Visit (INDEPENDENT_AMBULATORY_CARE_PROVIDER_SITE_OTHER): Payer: Managed Care, Other (non HMO) | Admitting: Family Medicine

## 2013-06-13 VITALS — BP 137/89 | HR 51 | Ht 63.0 in | Wt 121.0 lb

## 2013-06-13 DIAGNOSIS — M7501 Adhesive capsulitis of right shoulder: Secondary | ICD-10-CM

## 2013-06-13 DIAGNOSIS — M25511 Pain in right shoulder: Secondary | ICD-10-CM

## 2013-06-13 DIAGNOSIS — M25519 Pain in unspecified shoulder: Secondary | ICD-10-CM

## 2013-06-13 DIAGNOSIS — M7500 Adhesive capsulitis of unspecified shoulder: Secondary | ICD-10-CM

## 2013-06-13 NOTE — Progress Notes (Signed)
   Subjective:    Patient ID: [name], female    DOB: 06/14/1960, 53 y.o.   MRN: [id number]  HPI Right shoulder pain for the last 4-6 weeks. 7 months ago she had a tricep injury which is mostly healed although it still hurts sometimes when she does exercises area she thinks that may have contributed to her shoulder symptoms which now are shoulder stiffness and pain most of the time particularly with overhead reaching. 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 Right now she is in between her years. Shoulder pain is 4-6 out 10 most of the time, 8-10 at out of 10 at night. In aching. Does not radiate to her forearm. She's had no arm weakness.   Review of Systems Noted no right shoulder swelling or erythema. No fever, sweats, chills, unusual weight change.    Objective:   Physical Exam  Vital signs are reviewed GENERAL: Well-developed female no acute distress  SHOULDER: Forward flexion to about 90 without pain and then significant stiffness and pain from 90-160. Lateral abduction to 120 but pain in the last 30-40. Rotator cuff strength is 5 out of 5 in all planes. Distally she is neurovascularly intact. The a.c. joint is nontender to palpation. The biceps tendon is nontender to palpation. IMAGING: Reviewed her MRI which show some thickened capsule that could be consistent with adhesive capsulitis. There is no rotator cuff tear. ULTRASOUND: Dynamic ultrasound images reveal no sign of impingement. The a.c. joint is without significant arthropathy or effusion. All rotator cuff muscles are seen and are intact although in the supraspinatus there is a very small apparently old intrasubstance tear that's about 4 mm in diameter. There is no increased Doppler activity at this site.  PROCEDURE NOTE: Using ultrasound for guidance, we performed clinic injection as below. INJECTION: Patient was given informed consent, signed copy in the chart. Appropriate time out was  taken. Area prepped and draped in usual sterile fashion. One cc of methylprednisolone 40 mg/ml plus  9 cc of 1% lidocaine without epinephrine was injected into the glenohumeral joint using a(n) ultrasound-guided posterior approach approach. The patient tolerated the procedure well. There were no complications. Post procedure instructions were given.       Assessment & Plan:

## 2013-06-13 NOTE — Assessment & Plan Note (Signed)
Long discussion about adhesive capsulitis. We'll start her on a rehabilitation program. She has no underlying impingement signs are suspect this is a sequela of her triceps injury. We also looked at that briefly under are sent today and there is some area of old scarring but no triceps tear, no significant muscle edema or tendinitis, no significant calcifications. Will start with single large volume glenohumeral injection today. Rehabilitation program aggressively moving to blue Thera-Band. Followup 3-4 weeks. At that time we'll consider second high-volume glenohumeral injection.

## 2013-07-04 ENCOUNTER — Ambulatory Visit: Payer: Self-pay | Admitting: Family Medicine

## 2013-07-05 ENCOUNTER — Encounter: Payer: Self-pay | Admitting: *Deleted

## 2013-07-17 ENCOUNTER — Encounter: Payer: Managed Care, Other (non HMO) | Admitting: Obstetrics & Gynecology

## 2013-07-28 ENCOUNTER — Ambulatory Visit: Payer: Managed Care, Other (non HMO) | Admitting: Internal Medicine

## 2015-03-16 ENCOUNTER — Other Ambulatory Visit: Payer: Self-pay | Admitting: Family Medicine

## 2015-03-16 ENCOUNTER — Other Ambulatory Visit (HOSPITAL_COMMUNITY)
Admission: RE | Admit: 2015-03-16 | Discharge: 2015-03-16 | Disposition: A | Discharge status: Home / Self Care | Payer: Self-pay | Source: Ambulatory Visit | Attending: Family Medicine | Admitting: Family Medicine

## 2015-03-16 DIAGNOSIS — Z01419 Encounter for gynecological examination (general) (routine) without abnormal findings: Secondary | ICD-10-CM | POA: Insufficient documentation

## 2015-03-18 LAB — CYTOLOGY - PAP

## 2015-07-06 ENCOUNTER — Encounter (HOSPITAL_COMMUNITY): Payer: Self-pay

## 2015-07-06 ENCOUNTER — Emergency Department (INDEPENDENT_AMBULATORY_CARE_PROVIDER_SITE_OTHER)
Admission: EM | Admit: 2015-07-06 | Discharge: 2015-07-06 | Disposition: A | Discharge status: Home / Self Care | Payer: Managed Care, Other (non HMO) | Source: Home / Self Care | Attending: Family Medicine | Admitting: Family Medicine

## 2015-07-06 DIAGNOSIS — J3489 Other specified disorders of nose and nasal sinuses: Secondary | ICD-10-CM

## 2015-07-06 DIAGNOSIS — R51 Headache: Secondary | ICD-10-CM | POA: Diagnosis not present

## 2015-07-06 DIAGNOSIS — R519 Headache, unspecified: Secondary | ICD-10-CM

## 2015-07-06 MED ORDER — LEVOFLOXACIN 500 MG PO TABS: 500.0000 mg | ORAL_TABLET | Freq: Every day | ORAL | Status: DC

## 2015-07-06 NOTE — Discharge Instructions (Signed)
General Headache Without Cause A headache is pain or discomfort felt around the head or neck area. There are many causes and types of headaches. In some cases, the cause may not be found.  HOME CARE  Managing Pain  Take over-the-counter and prescription medicines only as told by your doctor.  Lie down in a dark, quiet room when you have a headache.  If directed, apply ice to the head and neck area:  Put ice in a plastic bag.  Place a towel between your skin and the bag.  Leave the ice on for 20 minutes, 2-3 times per day.  Use a heating pad or hot shower to apply heat to the head and neck area as told by your doctor.  Keep lights dim if bright lights bother you or make your headaches worse. Eating and Drinking  Eat meals on a regular schedule.  Lessen how much alcohol you drink.  Lessen how much caffeine you drink, or stop drinking caffeine. General Instructions  Keep all follow-up visits as told by your doctor. This is important.  Keep a journal to find out if certain things bring on headaches. For example, write down:  What you eat and drink.  How much sleep you get.  Any change to your diet or medicines.  Relax by getting a massage or doing other relaxing activities.  Lessen stress.  Sit up straight. Do not tighten (tense) your muscles.  Do not use tobacco products. This includes cigarettes, chewing tobacco, or e-cigarettes. If you need help quitting, ask your doctor.  Exercise regularly as told by your doctor.  Get enough sleep. This often means 7-9 hours of sleep. GET HELP IF:  Your symptoms are not helped by medicine.  You have a headache that feels different than the other headaches.  You feel sick to your stomach (nauseous) or you throw up (vomit).  You have a fever. GET HELP RIGHT AWAY IF:   Your headache becomes really bad.  You keep throwing up.  You have a stiff neck.  You have trouble seeing.  You have trouble speaking.  You have  pain in the eye or ear.  Your muscles are weak or you lose muscle control.  You lose your balance or have trouble walking.  You feel like you will pass out (faint) or you pass out.  You have confusion.   This information is not intended to replace advice given to you by your health care provider. Make sure you discuss any questions you have with your health care provider.   Document Released: 01/25/2008 Document Revised: 01/06/2015 Document Reviewed: 08/10/2014 Elsevier Interactive Patient Education 2016 Else[employer] Inc[employer] Sinus Headache A sinus headache happens when your sinuses become clogged or swollen. You may feel pain or pressure in your face, forehead, ears, or upper teeth. Sinus headaches can be mild or severe. HOME CARE  Take medicines only as told by your doctor.  If you were given an antibiotic medicine, finish all of it even if you start to feel better.  Use a nose spray if you feel stuffed up (congested).  If told, apply a warm, moist washcloth to your face to help lessen pain. GET HELP IF:  You get headaches more than one time each week.  Light or sound bothers you.  You have a fever.  You feel sick to your stomach (nauseous) or you throw up (vomit).  Your headaches do not get better with treatment. GET HELP RIGHT AWAY IF:  You have trouble seeing.  You suddenly have very bad pain in your face or head.  You start to twitch or shake (seizure).  You are confused.  You have a stiff neck.   This information is not intended to replace advice given to you by your health care provider. Make sure you discuss any questions you have with your health care provider.   Document Released: 08/17/2010 Document Revised: 09/01/2014 Document Reviewed: 04/13/2014 Elsevier Interactive Patient Education [employer]

## 2015-07-06 NOTE — ED Provider Notes (Signed)
CSN: [id number]     Arrival date & time 07/06/15  1401 History   First MD Initiated Contact with Patient 07/06/15 1539     Chief Complaint  Patient presents with  . Facial Pain  . Fever  . Otalgia   (Consider location/radiation/quality/duration/timing/severity/associated sxs/prior Treatment) HPI History obtained from patient:  Pt presents with the cc WU:[id number]:sinus pressure and pain Duration of symptoms:since January Treatment prior to arrival:as prescribed by PCP Previous symptoms of this type before: yes, but unsure what treatment was Pain score: Other symptoms include:nasal congestion, sore throat.  Past Medical History  Diagnosis Date  . Renal disorder    History reviewed. No pertinent past surgical history. Family History  Problem Relation Age of Onset  . Heart disease Father   . Hypertension Father    Social History  Substance Use Topics  . Smoking status: Never Smoker   . Smokeless tobacco: Never Used  . Alcohol Use: No   OB History    No data available     Review of Systems Sinus pressure and pain Allergies  Amoxicillin and Sulfur  Home Medications   Prior to Admission medications   Medication Sig Start Date End Date Taking? Authorizing Provider  cetirizine (ZYRTEC) 10 MG tablet Take 10 mg by mouth daily.   Yes Historical Provider, MD  citalopram (CELEXA) 20 MG tablet Take 20 mg by mouth daily.    Historical Provider, MD  levofloxacin (LEVAQUIN) 500 MG tablet Take 1 tablet (500 mg total) by mouth daily. 07/06/15   Tharon AquasFrank C Ron Beske, PA   Meds Ordered and Administered this Visit  Medications - No data to display  BP 143/75 mmHg  Pulse 64  Temp(Src) 99.3 F (37.4 C) (Oral)  Resp 16  SpO2 100% No data found.   Physical Exam NURSES NOTES AND VITAL SIGNS REVIEWED. CONSTITUTIONAL: Well developed, well nourished, no acute distress HEENT: normocephalic, atraumatic, right and left TM's are normal, no sinus tenderness to palpation EYES: Conjunctiva  normal NECK:normal ROM, supple, no adenopathy PULMONARY:No respiratory distress, normal effort, Lungs: CTAb/l, no wheezes, or increased work of breathing CARDIOVASCULAR: RRR, no murmur ABDOMEN: soft, ND, NT, +'ve BS MUSCULOSKELETAL: Normal ROM of all extremities,  SKIN: warm and dry without rash PSYCHIATRIC: Mood and affect, behavior are normal  ED Course  Procedures (including critical care time)  Labs Review Labs Reviewed - No data to display  Imaging Review No results found.   Visual Acuity Review  Right Eye Distance:   Left Eye Distance:   Bilateral Distance:    Right Eye Near:   Left Eye Near:    Bilateral Near:        RX Levofloxacin  MDM   1. Sinus pressure   2. Sinus headache    Patient is reassured that there are no issues that require transfer to higher level of care at this time.  Patient is advised to continue home symptomatic treatment. Prescription is sent to  pharmacy patient has indicated.  Patient is advised that if there are new or worsening symptoms or attend the emergency department, or contact primary care provider. Instructions of care provided discharged home in stable condition. Return to work/school note provided.  THIS NOTE WAS GENERATED USING A VOICE RECOGNITION SOFTWARE PROGRAM. ALL REASONABLE EFFORTS  WERE MADE TO PROOFREAD THIS DOCUMENT FOR ACCURACY.     Tharon AquasFrank C Zaiah Credeur, PA 07/06/15 1642

## 2015-07-06 NOTE — ED Notes (Signed)
Pt states she has a sinus headache and her breathing has become labored and pain in right ear x3 months

## 2015-08-24 ENCOUNTER — Ambulatory Visit (INDEPENDENT_AMBULATORY_CARE_PROVIDER_SITE_OTHER): Payer: 59 | Admitting: Internal Medicine

## 2015-08-24 ENCOUNTER — Encounter: Payer: Self-pay | Admitting: Internal Medicine

## 2015-08-24 ENCOUNTER — Encounter: Payer: Self-pay | Admitting: Emergency Medicine

## 2015-08-24 VITALS — BP 150/100 | HR 63 | Temp 98.2°F | Resp 16 | Wt 149.0 lb

## 2015-08-24 DIAGNOSIS — F840 Autistic disorder: Secondary | ICD-10-CM | POA: Diagnosis not present

## 2015-08-24 DIAGNOSIS — F329 Major depressive disorder, single episode, unspecified: Secondary | ICD-10-CM | POA: Diagnosis not present

## 2015-08-24 DIAGNOSIS — F32A Depression, unspecified: Secondary | ICD-10-CM | POA: Insufficient documentation

## 2015-08-24 DIAGNOSIS — J3089 Other allergic rhinitis: Secondary | ICD-10-CM

## 2015-08-24 DIAGNOSIS — IMO0001 Reserved for inherently not codable concepts without codable children: Secondary | ICD-10-CM | POA: Insufficient documentation

## 2015-08-24 DIAGNOSIS — R030 Elevated blood-pressure reading, without diagnosis of hypertension: Secondary | ICD-10-CM

## 2015-08-24 DIAGNOSIS — G479 Sleep disorder, unspecified: Secondary | ICD-10-CM

## 2015-08-24 DIAGNOSIS — J309 Allergic rhinitis, unspecified: Secondary | ICD-10-CM | POA: Insufficient documentation

## 2015-08-24 DIAGNOSIS — F419 Anxiety disorder, unspecified: Secondary | ICD-10-CM

## 2015-08-24 MED ORDER — BUPROPION HCL ER (XL) 150 MG PO TB24: 150.0000 mg | ORAL_TABLET | Freq: Every day | ORAL | Status: DC

## 2015-08-24 MED ORDER — IBUPROFEN-DIPHENHYDRAMINE HCL 200-25 MG PO CAPS: ORAL_CAPSULE | ORAL | Status: DC

## 2015-08-24 MED ORDER — CETIRIZINE HCL 10 MG PO TABS: 10.0000 mg | ORAL_TABLET | Freq: Every day | ORAL | Status: DC

## 2015-08-24 MED ORDER — TRAZODONE HCL 50 MG PO TABS: 50.0000 mg | ORAL_TABLET | Freq: Every evening | ORAL | Status: DC | PRN

## 2015-08-24 NOTE — Assessment & Plan Note (Signed)
Referred to psychiatry and psychology

## 2015-08-24 NOTE — Assessment & Plan Note (Signed)
Not controlled She has tried several medications and had multiple side effects The only medication she is not sure if she had side effects to light not was Wellbutrin and she is willing to try this again Discussed that this is mild and may not be effective. Discussed this may increase her anxiety or affect her sleep If she has side effects she will call or stop the medication We will refer to psychiatry and psychology Follow-up in 4 weeks, sooner if needed

## 2015-08-24 NOTE — Assessment & Plan Note (Signed)
Anxiety is not controlled She has been on several medications and is nervous about trying anything Referred to psychiatry and psychology for both medication management and therapy We are starting Wellbutrin and I did discuss with her that this may increase her anxiety

## 2015-08-24 NOTE — Progress Notes (Signed)
Subjective:    Patient ID: [name], female    DOB: 07/07/1960, 55 y.o.   MRN: [id number]  HPI She is here to establish with a new pcp.    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 She also suffers from anxiety, depression and currently sleep difficulties. Everything has gotten so bad that she has begun point pulling her hair out, which she has done in the past. She states she has been on several medications and has had side effects from most. Celexa caused increased anxiety, rash and worsened insomnia.  Zoloft made her gain weight.  She moved here to help care for her father who is ill and he has since passed away. He died in September 2016 and she feels she has not dealt with diarrhea. She is living with her mother to help save money so that she never has to see her again. They do not have a good relationship. 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 She knows she needs a job that has the same hours every day that will allow her to go to the gym daily - she knows that makes such a big difference for her anxiety, depression, sleep overall health. She has been looking for another job, but has not been able to get one. She is So Depressed That She Does Not Want to Exercise. She Has Gained A Lot Of Weight.  Elevated blood pressure: Her blood pressure is elevated here today. She is not monitoring. She states that it was elevated in the past and it was related to weight gain. She notes that if she is exercising regularly and this is weight her blood pressure is in the normal range. She would prefer not to go on medication. She does have palpitations, but relates that to anxiety. She denies any chest pain. She has frequent headaches and occasional lightheadedness.  Medications and allergies reviewed with patient and updated if appropriate.  Patient Active Problem List   Diagnosis  Date Noted  . Allergic rhinitis 08/24/2015  . Autism 08/24/2015  . Sleep difficulties 08/24/2015  . Depression 08/24/2015  . Anxiety 08/24/2015  . Adhesive capsulitis of right shoulder 06/13/2013    No current outpatient prescriptions on file prior to visit.   No current facility-administered medications on file prior to visit.    Past Medical History  Diagnosis Date  . Renal disorder   . Anxiety   . Depression   . Autism   . Hypertension   . Hyperlipidemia   . Heart murmur     No past surgical history on file.  Social History   Social History  . Marital Status: Single    Spouse Name: N/A  . Number of Children: N/A  . Years of Education: N/A   Social History Main Topics  . Smoking status: Never Smoker   . Smokeless tobacco: Never Used  . Alcohol Use: No  . Drug Use: No  . Sexual Activity: No   Other Topics Concern  . None   Social History Narrative    Family History  Problem Relation Age of Onset  . Heart disease Father   . Hypertension Father     Review of Systems  Constitutional: Positive for chills (intermittent), appetite change (decreased) and fatigue (no energy level). Negative for fever.  Respiratory: Positive for shortness of breath (anxiety related). Negative for cough and wheezing.  Cardiovascular: Positive for palpitations (medication related). Negative for chest pain and leg swelling.  Gastrointestinal: Negative for abdominal pain, diarrhea, constipation and blood in stool.       GERD from not eating well  Neurological: Positive for light-headedness (occasional) and headaches (frequent).  Psychiatric/Behavioral: Positive for sleep disturbance and dysphoric mood. Negative for suicidal ideas (Denies homicidal thoughts). The patient is nervous/anxious.        Objective:   Filed Vitals:   08/24/15 1518  BP: 150/100  Pulse: 63  Temp: 98.2 F (36.8 C)  Resp: 16   Filed Weights   08/24/15 1518  Weight: 149 lb (67.586 kg)   Body mass  index is 26.4 kg/(m^2).   Physical Exam Constitutional: Appears well-developed and well-nourished. No distress.  Neck: Neck supple. No tracheal deviation present. No thyromegaly present.  No carotid bruit. No cervical adenopathy.  oropharynx moist and clear Cardiovascular: Normal rate, regular rhythm and normal heart sounds.   No murmur heard.  No edema Pulmonary/Chest: Effort normal and breath sounds normal. No respiratory distress. No wheezes.  Abdomen: Soft, nontender, nondistended Skin: Warm, dry Psychiatric: Anxious and depressed at times, crying intermittently        Assessment & Plan:   See Problem List for Assessment and Plan of chronic medical problems.  Follow-up in 4 weeks, sooner if needed

## 2015-08-24 NOTE — Assessment & Plan Note (Signed)
Blood pressure elevated here today-she is nervous to be here so there may be a component of whitecoat hypertension She will start monitoring her blood pressure at home" pharmacy Stressed low-sodium diet, regular exercise and weight loss She had recent blood work done through her gynecologist, which was normal Follow-up in 4 weeks to recheck blood pressure

## 2015-08-24 NOTE — Assessment & Plan Note (Signed)
She is currently taking Advil PM, which does not always work Trial of trazodone 50-100 mg at night Referred to psychiatry/psychology for possible medication management and therapy

## 2015-08-24 NOTE — Progress Notes (Signed)
Pre visit review using our clinic review tool, if applicable. No additional management support is needed unless otherwise documented below in the visit note. 

## 2015-08-24 NOTE — Patient Instructions (Signed)
Start monitoring your blood pressure at home or at a pharmacy.  It should be less than 140/90.   Medications reviewed and updated.  Changes include starting wellbutrin 150 mg in the morning and trazodone 50-100 mg at bedtime.    Your prescription(s) have been submitted to your pharmacy. Please take as directed and contact our office if you believe you are having problem(s) with the medication(s).  A referral was ordered for psychiatry and psychology.    Please followup in 4 weeks.

## 2015-09-21 ENCOUNTER — Encounter: Payer: 59 | Admitting: Internal Medicine

## 2015-09-21 DIAGNOSIS — Z0289 Encounter for other administrative examinations: Secondary | ICD-10-CM

## 2015-09-21 NOTE — Progress Notes (Signed)
    Subjective:    Patient ID: [name], female    DOB: 07/30/1960, 55 y.o.   MRN: [id number]  HPI No show  Medications and allergies reviewed with patient and updated if appropriate.  Patient Active Problem List   Diagnosis Date Noted  . Allergic rhinitis 08/24/2015  . Autism 08/24/2015  . Sleep difficulties 08/24/2015  . Depression 08/24/2015  . Anxiety 08/24/2015  . Elevated blood pressure 08/24/2015  . 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 buPROPion (WELLBUTRIN XL) 150 MG 24 hr tablet Take 1 tablet (150 mg total) by mouth daily. 30 tablet 3  . cetirizine (ZYRTEC) 10 MG tablet Take 1 tablet (10 mg total) by mouth daily.    . Ibuprofen-Diphenhydramine HCl (ADVIL PM) 200-25 MG CAPS Bedtime prn 10 each   . traZODone (DESYREL) 50 MG tablet Take 1-2 tablets (50-100 mg total) by mouth at bedtime as needed for sleep. 60 tablet 3   No current facility-administered medications on file prior to visit.    Past Medical History  Diagnosis Date  . Renal disorder   . Anxiety   . Depression   . Autism   . Hypertension   . Hyperlipidemia   . Heart murmur     No past surgical history on file.  Social History   Social History  . Marital Status: Single    Spouse Name: N/A  . Number of Children: N/A  . Years of Education: N/A   Social History Main Topics  . Smoking status: Never Smoker   . Smokeless tobacco: Never Used  . Alcohol Use: No  . Drug Use: No  . Sexual Activity: No   Other Topics Concern  . Not on file   Social History Narrative    Family History  Problem Relation Age of Onset  . Heart disease Father   . Hypertension Father   . Other Father     idiopathic pulmonary fibrosis    Review of Systems     Objective:  There were no vitals filed for this visit. There were no vitals filed for this visit. There is no weight on file to  calculate BMI.   Physical Exam        Assessment & Plan:    This encounter was created in error - please disregard.

## 2015-11-24 ENCOUNTER — Encounter (HOSPITAL_COMMUNITY): Payer: Self-pay

## 2016-01-20 ENCOUNTER — Ambulatory Visit: Payer: 59 | Admitting: Nurse Practitioner

## 2016-01-24 ENCOUNTER — Ambulatory Visit: Payer: 59 | Admitting: Nurse Practitioner

## 2016-01-24 DIAGNOSIS — Z029 Encounter for administrative examinations, unspecified: Secondary | ICD-10-CM

## 2016-03-09 ENCOUNTER — Other Ambulatory Visit: Payer: Self-pay | Admitting: *Deleted

## 2016-03-09 MED ORDER — TRAZODONE HCL 50 MG PO TABS: 50.0000 mg | ORAL_TABLET | Freq: Every evening | ORAL | 0 refills | Status: DC | PRN

## 2016-05-31 ENCOUNTER — Emergency Department
Admission: EM | Admit: 2016-05-31 | Discharge: 2016-05-31 | Disposition: A | Discharge status: Home / Self Care | Payer: 59 | Attending: Emergency Medicine | Admitting: Emergency Medicine

## 2016-05-31 DIAGNOSIS — I10 Essential (primary) hypertension: Secondary | ICD-10-CM | POA: Insufficient documentation

## 2016-05-31 DIAGNOSIS — J069 Acute upper respiratory infection, unspecified: Secondary | ICD-10-CM | POA: Insufficient documentation

## 2016-05-31 DIAGNOSIS — J40 Bronchitis, not specified as acute or chronic: Secondary | ICD-10-CM | POA: Insufficient documentation

## 2016-05-31 MED ORDER — IPRATROPIUM-ALBUTEROL 0.5-2.5 (3) MG/3ML IN SOLN
RESPIRATORY_TRACT | Status: AC
  Filled 2016-05-31: qty 3

## 2016-05-31 MED ORDER — DEXAMETHASONE SODIUM PHOSPHATE 10 MG/ML IJ SOLN
10.0000 mg | Freq: Once | INTRAMUSCULAR | Status: AC
  Administered 2016-05-31: 10 mg via INTRAMUSCULAR
  Filled 2016-05-31: qty 1

## 2016-05-31 MED ORDER — BENZONATATE 100 MG PO CAPS: 100.0000 mg | ORAL_CAPSULE | Freq: Three times a day (TID) | ORAL | 0 refills | Status: DC | PRN

## 2016-05-31 MED ORDER — AZITHROMYCIN 250 MG PO TABS: ORAL_TABLET | ORAL | 0 refills | Status: DC

## 2016-05-31 MED ORDER — IPRATROPIUM-ALBUTEROL 0.5-2.5 (3) MG/3ML IN SOLN
3.0000 mL | Freq: Once | RESPIRATORY_TRACT | Status: AC
  Administered 2016-05-31: 3 mL via RESPIRATORY_TRACT

## 2016-05-31 NOTE — Discharge Instructions (Signed)
Your symptoms likely represent a viral respiratory infection. You are being covered with an antibiotic due to the long duration of symptoms. Take the OTC meds as before. Start the prescription meds as directed. Follow-up with your provider for continued or worsening symptoms.

## 2016-05-31 NOTE — ED Triage Notes (Signed)
Pt reports URI sx X 3 days, ear pain, body aches. Pt taking OTC medications with no relief.

## 2016-05-31 NOTE — ED Provider Notes (Signed)
[facility] Emergency Department Provider Note ____________________________________________  Time seen: 1515  I have reviewed the triage vital signs and the nursing notes.  HISTORY  Chief Complaint  Nasal Congestion; Cough; and Fever   HPI [name] is a 56 y.o. female visit to the ED with a 3 day complaint of upper respiratory infection symptoms that include generalized body aches, earaches, and a nonproductive cough. Patient also notes subjective fevers, as well as some nausea with related diarrhea. She denies any rashes, chest pain, or significant shortness of breath.  Past Medical History:  Diagnosis Date  . Anxiety   . Autism   . Depression   . Heart murmur   . Hyperlipidemia   . Hypertension   . Renal disorder     Patient Active Problem List   Diagnosis Date Noted  . Allergic rhinitis 08/24/2015  . Autism 08/24/2015  . Sleep difficulties 08/24/2015  . Depression 08/24/2015  . Anxiety 08/24/2015  . Elevated blood pressure 08/24/2015  . Adhesive capsulitis of right shoulder 06/13/2013    History reviewed. No pertinent surgical history.  Prior to Admission medications   Medication Sig Start Date End Date Taking? Authorizing Provider  azithromycin (ZITHROMAX Z-PAK) 250 MG tablet Take 2 tablets (500 mg) on  Day 1,  followed by 1 tablet (250 mg) once daily on Days 2 through 5. 05/31/16   Zoriana Oats V Bacon Debria Broecker, PA-C  benzonatate (TESSALON PERLES) 100 MG capsule Take 1 capsule (100 mg total) by mouth 3 (three) times daily as needed for cough (Take 1-2 per dose). 05/31/16   Bryton Waight V Bacon Amante Fomby, PA-C  buPROPion (WELLBUTRIN XL) 150 MG 24 hr tablet Take 1 tablet (150 mg total) by mouth daily. 08/24/15   Pincus Sanes, MD  cetirizine (ZYRTEC) 10 MG tablet Take 1 tablet (10 mg total) by mouth daily. 08/24/15   Pincus Sanes, MD  Ibuprofen-Diphenhydramine HCl (ADVIL PM) 200-25 MG CAPS Bedtime prn 08/24/15   Pincus Sanes, MD  traZODone (DESYREL)  50 MG tablet Take 1-2 tablets (50-100 mg total) by mouth at bedtime as needed for sleep. 03/09/16   Pincus Sanes, MD    Allergies Amoxicillin; Sulfur; and Celexa [citalopram hydrobromide]  Family History  Problem Relation Age of Onset  . Heart disease Father   . Hypertension Father   . Other Father     idiopathic pulmonary fibrosis    Social History Social History  Substance Use Topics  . Smoking status: Never Smoker  . Smokeless tobacco: Never Used  . Alcohol use No    Review of Systems  Constitutional: Negative for fever. Eyes: Negative for visual changes. ENT: Negative for sore throat. Reports bilateral earaches and nasal congestion. Cardiovascular: Negative for chest pain. Respiratory: Negative for shortness of breath. Reports nonproductive cough as above. Gastrointestinal: Negative for abdominal pain, vomiting. Reports nausea and diarrhea. Genitourinary: Negative for dysuria. Musculoskeletal: Negative for back pain. Describes generalized bodyaches. Skin: Negative for rash. Neurological: Negative for headaches, focal weakness or numbness. ____________________________________________  PHYSICAL EXAM:  VITAL SIGNS: ED Triage Vitals [05/31/16 1445]  Enc Vitals Group     BP (!) 136/91     Pulse Rate 60     Resp 18     Temp 98.3 F (36.8 C)     Temp Source Oral     SpO2 100 %     Weight 150 lb (68 kg)     Height 5\' 4"  (1.626 m)     Head Circumference  Peak Flow      Pain Score 8     Pain Loc      Pain Edu?      Excl. in GC?     Constitutional: Alert and oriented. Well appearing and in no distress. Head: Normocephalic and atraumatic. Eyes: Conjunctivae are normal. PERRL. Normal extraocular movements Ears: Canals clear. TMs intact bilaterally. Nose: No congestion/rhinorrhea/epistaxis. Erythematous nasal turbinates Mouth/Throat: Mucous membranes are moist. Uvula Is midline and tonsils are flat. Neck: Supple. No  thyromegaly. Hematological/Lymphatic/Immunological: No cervical lymphadenopathy. Cardiovascular: Normal rate, regular rhythm. Normal distal pulses. Respiratory: Normal respiratory effort. No wheezes/rales/rhonchi. Skin:  Skin is warm, dry and intact. No rash noted. ____________________________________________  PROCEDURES  DuoNeb x 1 Decadron 10 mg IM ____________________________________________  INITIAL IMPRESSION / ASSESSMENT AND PLAN / ED COURSE  Patient with presentation of symptoms that likely represent a viral URI. She also may have development of a early bronchitis given the duration of her symptoms. She'll be discharged at this time with [facility] as well as a azithromycin. He is provided with a 10 mg dose of Decadron IM while in the ED. She is also provided with a breathing treatment here for rest of her symptoms. Her vital signs are stable and she'll be discharged her primary care provider. She is encouraged to follow-up if symptoms persist beyond a week. ____________________________________________  FINAL CLINICAL IMPRESSION(S) / ED DIAGNOSES  Final diagnoses:  Viral upper respiratory tract infection  Bronchitis     Lissa HoardJenise V Bacon Yadier Bramhall, PA-C 05/31/16 1744    Myrna Blazeravid Matthew Schaevitz, MD 05/31/16 [phone]

## 2017-12-13 ENCOUNTER — Other Ambulatory Visit: Payer: Self-pay

## 2017-12-13 ENCOUNTER — Encounter (HOSPITAL_COMMUNITY): Payer: Self-pay | Admitting: Emergency Medicine

## 2017-12-13 ENCOUNTER — Emergency Department (HOSPITAL_COMMUNITY): Payer: Self-pay

## 2017-12-13 ENCOUNTER — Emergency Department (HOSPITAL_COMMUNITY)
Admission: EM | Admit: 2017-12-13 | Discharge: 2017-12-14 | Disposition: A | Discharge status: Home / Self Care | Payer: Self-pay | Attending: Emergency Medicine | Admitting: Emergency Medicine

## 2017-12-13 DIAGNOSIS — Z79899 Other long term (current) drug therapy: Secondary | ICD-10-CM | POA: Insufficient documentation

## 2017-12-13 DIAGNOSIS — F840 Autistic disorder: Secondary | ICD-10-CM | POA: Insufficient documentation

## 2017-12-13 DIAGNOSIS — D696 Thrombocytopenia, unspecified: Secondary | ICD-10-CM | POA: Insufficient documentation

## 2017-12-13 DIAGNOSIS — I10 Essential (primary) hypertension: Secondary | ICD-10-CM | POA: Insufficient documentation

## 2017-12-13 DIAGNOSIS — R51 Headache: Secondary | ICD-10-CM | POA: Insufficient documentation

## 2017-12-13 LAB — CBC
HEMATOCRIT: 49.8 % — AB (ref 36.0–46.0)
Hemoglobin: 16.1 g/dL — ABNORMAL HIGH (ref 12.0–15.0)
MCH: 30.2 pg (ref 26.0–34.0)
MCHC: 32.3 g/dL (ref 30.0–36.0)
MCV: 93.4 fL (ref 78.0–100.0)
PLATELETS: 48 10*3/uL — AB (ref 150–400)
RBC: 5.33 MIL/uL — ABNORMAL HIGH (ref 3.87–5.11)
RDW: 12.2 % (ref 11.5–15.5)
WBC: 6.9 10*3/uL (ref 4.0–10.5)

## 2017-12-13 LAB — BASIC METABOLIC PANEL
Anion gap: 12 (ref 5–15)
BUN: 10 mg/dL (ref 6–20)
CHLORIDE: 105 mmol/L (ref 98–111)
CO2: 23 mmol/L (ref 22–32)
CREATININE: 0.76 mg/dL (ref 0.44–1.00)
Calcium: 10 mg/dL (ref 8.9–10.3)
GFR calc Af Amer: >60 mL/min (ref >60)
GFR calc non Af Amer: >60 mL/min (ref >60)
GLUCOSE: 95 mg/dL (ref 70–99)
Potassium: 4 mmol/L (ref 3.5–5.1)
SODIUM: 140 mmol/L (ref 135–145)

## 2017-12-13 LAB — I-STAT TROPONIN, ED: Troponin i, poc: 0 ng/mL (ref 0.00–0.08)

## 2017-12-13 LAB — I-STAT BETA HCG BLOOD, ED (MC, WL, AP ONLY): I-stat hCG, quantitative: <5 m[IU]/mL (ref <5)

## 2017-12-13 NOTE — ED Triage Notes (Signed)
Pt c/o hypertension x 1 week, headaches that occur at the same time everyday and intermittent chest pain/shortness of breath. Hx hypertension, not currently taking medications.

## 2017-12-13 NOTE — ED Provider Notes (Signed)
[facility] Provider Note   CSN: [id number] Arrival date & time: 12/13/17  1730     History   Chief Complaint Chief Complaint  Patient presents with  . Hypertension  . Chest Pain    HPI [name] is a 57 y.o. female.  The history is provided by the patient.  Hypertension  This is a recurrent problem. The current episode started yesterday. The problem occurs constantly. The problem has not changed since onset.Associated symptoms include chest pain and headaches. Pertinent negatives include no abdominal pain and no shortness of breath. Nothing aggravates the symptoms. Nothing relieves the symptoms. She has tried nothing for the symptoms. The treatment provided no relief.  Chest Pain   Associated symptoms include headaches. Pertinent negatives include no abdominal pain, no back pain, no diaphoresis, no dizziness, no fever, no nausea, no numbness, no palpitations, no shortness of breath, no vomiting and no weakness.  Her past medical history is significant for hypertension.  Pertinent negatives for past medical history include no seizures.  Also having headaches because she is not sleeping because her brother has cancer. She is not currently seeing a PMD.    Past Medical History:  Diagnosis Date  . Anxiety   . Autism   . Depression   . Heart murmur   . Hyperlipidemia   . Hypertension   . Renal disorder     Patient Active Problem List   Diagnosis Date Noted  . Allergic rhinitis 08/24/2015  . Autism 08/24/2015  . Sleep difficulties 08/24/2015  . Depression 08/24/2015  . Anxiety 08/24/2015  . Elevated blood pressure 08/24/2015  . Adhesive capsulitis of right shoulder 06/13/2013    History reviewed. No pertinent surgical history.   OB History   None      Home Medications    Prior to Admission medications   Medication Sig Start Date End Date Taking? Authorizing Provider  Calcium-Vitamin D-Vitamin K 650-12.5-40  MG-MCG-MCG CHEW Chew 1 tablet by mouth daily.   Yes [provider]  cetirizine (ZYRTEC) 10 MG tablet Take 1 tablet (10 mg total) by mouth daily. 08/24/15  Yes Burns, Bobette MoStacy J, MD  omega-3 acid ethyl esters (LOVAZA) 1 g capsule Take 1 g by mouth daily.   Yes [provider]  traZODone (DESYREL) 50 MG tablet Take 1-2 tablets (50-100 mg total) by mouth at bedtime as needed for sleep. 03/09/16  Yes Burns, Bobette MoStacy J, MD  vitamin B-12 (CYANOCOBALAMIN) 500 MCG tablet Take 500 mcg by mouth daily.   Yes [provider]  vitamin E 400 UNIT capsule Take 400 Units by mouth daily.   Yes [provider]  azithromycin (ZITHROMAX Z-PAK) 250 MG tablet Take 2 tablets (500 mg) on  Day 1,  followed by 1 tablet (250 mg) once daily on Days 2 through 5. Patient not taking: Reported on 12/13/2017 05/31/16   Menshew, Charlesetta IvoryJenise V Bacon, PA-C  benzonatate (TESSALON PERLES) 100 MG capsule Take 1 capsule (100 mg total) by mouth 3 (three) times daily as needed for cough (Take 1-2 per dose). Patient not taking: Reported on 12/13/2017 05/31/16   Menshew, Charlesetta IvoryJenise V Bacon, PA-C  buPROPion (WELLBUTRIN XL) 150 MG 24 hr tablet Take 1 tablet (150 mg total) by mouth daily. Patient not taking: Reported on 12/13/2017 08/24/15   Pincus SanesBurns, Stacy J, MD  Ibuprofen-Diphenhydramine HCl (ADVIL PM) 200-25 MG CAPS Bedtime prn Patient not taking: Reported on 12/13/2017 08/24/15   Pincus SanesBurns, Stacy J, MD    Family History  Family History  Problem Relation Age of Onset  . Heart disease Father   . Hypertension Father   . Other Father        idiopathic pulmonary fibrosis    Social History Social History   Tobacco Use  . Smoking status: Never Smoker  . Smokeless tobacco: Never Used  Substance Use Topics  . Alcohol use: No  . Drug use: No     Allergies   Amoxicillin; Sulfur; and Celexa [citalopram hydrobromide]   Review of Systems Review of Systems  Constitutional: Negative for diaphoresis and fever.  Eyes: Negative  for visual disturbance.  Respiratory: Negative for shortness of breath.   Cardiovascular: Positive for chest pain. Negative for palpitations and leg swelling.  Gastrointestinal: Negative for abdominal pain, anal bleeding, blood in stool, nausea and vomiting.  Musculoskeletal: Negative for back pain, myalgias and neck pain.  Skin: Negative for color change, pallor and rash.  Neurological: Positive for headaches. Negative for dizziness, tremors, seizures, syncope, facial asymmetry, speech difficulty, weakness, light-headedness and numbness.  Hematological: Negative for adenopathy. Does not bruise/bleed easily.  Psychiatric/Behavioral: Negative for confusion and decreased concentration.  All other systems reviewed and are negative.    Physical Exam Updated Vital Signs BP (!) 154/90 (BP Location: Right Arm)   Pulse 60   Temp 98.5 F (36.9 C) (Oral)   Resp 15   SpO2 99%   Physical Exam  Constitutional: She is oriented to person, place, and time. She appears well-developed and well-nourished. No distress.  HENT:  Head: Normocephalic and atraumatic.  Nose: Nose normal.  Mouth/Throat: No oropharyngeal exudate.  Eyes: Pupils are equal, round, and reactive to light. Conjunctivae and EOM are normal.  Neck: Normal range of motion. Neck supple. No JVD present.  Cardiovascular: Normal rate, regular rhythm, normal heart sounds and intact distal pulses.  Pulmonary/Chest: Effort normal and breath sounds normal. No stridor. She has no wheezes. She has no rales.  Abdominal: Soft. Bowel sounds are normal. She exhibits no mass. There is no tenderness. There is no rebound and no guarding.  Musculoskeletal: Normal range of motion.  Neurological: She is alert and oriented to person, place, and time. She displays normal reflexes. No cranial nerve deficit.  Skin: Skin is warm and dry. Capillary refill takes less than 2 seconds.  No petechiae no purpura  Psychiatric: She has a normal mood and affect.    Nursing note and vitals reviewed.    ED Treatments / Results  Labs (all labs ordered are listed, but only abnormal results are displayed) Labs Reviewed  CBC - Abnormal; Notable for the following components:      Result Value   RBC 5.33 (*)    Hemoglobin 16.1 (*)    HCT 49.8 (*)    Platelets 48 (*)    All other components within normal limits  BASIC METABOLIC PANEL  I-STAT TROPONIN, ED  I-STAT BETA HCG BLOOD, ED ([facility], [facility], [facility] ONLY)  I-STAT TROPONIN, ED    EKG EKG Interpretation  Date/Time:  Thursday December 13 2017 17:40:25 EDT Ventricular Rate:  62 PR Interval:  138 QRS Duration: 84 QT Interval:  432 QTC Calculation: 438 R Axis:   52 Text Interpretation:  Normal sinus rhythm Confirmed by Nicanor Alcon, Analee Montee ([id number]) on 12/13/2017 11:20:48 PM   Radiology Dg Chest 2 View  Result Date: 12/13/2017 CLINICAL DATA:  Left side chest pain.  Hypertension EXAM: CHEST - 2 VIEW COMPARISON:  10/02/2010 FINDINGS: Heart and mediastinal contours are within normal limits. No focal opacities  or effusions. No acute bony abnormality. IMPRESSION: No active cardiopulmonary disease. Electronically Signed   By: Charlett NoseKevin  Dover M.D.   On: 12/13/2017 19:05    Procedures Procedures (including critical care time)  Medications Ordered in ED Medications - No data to display     Final Clinical Impressions(s) / ED Diagnoses    Return for numbness, changes in vision or speech, fevers >100.4 unrelieved by medication, shortness of breath, intractable vomiting, or diarrhea, abdominal pain, Inability to tolerate liquids or food, cough, altered mental status or any concerns. No signs of systemic illness or infection. The patient is nontoxic-appearing on exam and vital signs are within normal limits. Will refer to urology for microscopy hematuria as patient is asymptomatic.  I have reviewed the triage vital signs and the nursing notes. Pertinent labs &imaging results that were available during my care of  the patient were reviewed by me and considered in my medical decision making (see chart for details).  After history, exam, and medical workup I feel the patient has been appropriately medically screened and is safe for discharge home. Pertinent diagnoses were discussed with the patient. Patient was given return precautions.   Afua Hoots, MD 12/14/17 [id number]

## 2017-12-13 NOTE — ED Provider Notes (Signed)
Patient placed in Quick Look pathway, seen and evaluated   Chief Complaint: headache  HPI:   [name] is a 57 y.o. female who presents to the ED with dental pain and headache. Patient reports that a tooth on the left lower dental area is tender and the gum is red. Patient states at first she thought her head was hurting due to her BP being elevated but now she thinks it may be due to the tooth.  ROS: Neuro: headache  ENT: dental pain   Physical Exam:   BP (!) 177/58 (BP Location: Right Arm)   Pulse 90   Temp 98.5 F (36.9 C) (Oral)   Resp 18   SpO2 100%   Gen: No distress  Neuro: Awake and Alert  Skin: Warm and dry  ENT: left lower first molar with swelling of the gum surrounding the tooth, erythema and a small ulcer area.  Initiation of care has begun. The patient has been counseled on the process, plan, and necessity for staying for the completion/evaluation, and the remainder of the medical screening examination    Janne Napoleoneese, Hope M, NP 12/13/17 1811    Lorre NickAllen, Anthony, MD 12/13/17 2228

## 2017-12-14 LAB — I-STAT TROPONIN, ED: TROPONIN I, POC: 0.01 ng/mL (ref 0.00–0.08)

## 2018-07-05 ENCOUNTER — Encounter: Payer: Self-pay | Admitting: Family Medicine

## 2018-07-05 ENCOUNTER — Ambulatory Visit: Payer: BLUE CROSS/BLUE SHIELD | Admitting: Family Medicine

## 2018-07-05 ENCOUNTER — Encounter: Payer: Self-pay | Admitting: Emergency Medicine

## 2018-07-05 VITALS — BP 124/86 | HR 61 | Temp 98.3°F | Resp 16 | Ht 64.0 in | Wt 161.0 lb

## 2018-07-05 DIAGNOSIS — J0110 Acute frontal sinusitis, unspecified: Secondary | ICD-10-CM | POA: Insufficient documentation

## 2018-07-05 MED ORDER — AZITHROMYCIN 250 MG PO TABS: ORAL_TABLET | ORAL | 0 refills | Status: DC

## 2018-07-05 NOTE — Patient Instructions (Signed)
Nice to meet you  Please try honey, vick's vapor rub, lozenges and humidifer for cough and sore throat  Please see [place] back if you fail to improve.

## 2018-07-05 NOTE — Assessment & Plan Note (Signed)
Likely viral. Has been ongoing for about a week. Lungs sound clear  - counseled on supportive care - azithro provided if no improvement  - given indications to follow up.

## 2018-07-05 NOTE — Progress Notes (Signed)
[name] - 58 y.o. female MRN [id number]  Date of birth: 10-May-1960  SUBJECTIVE:  Including CC & ROS.  No chief complaint on file.   [name] is a 58 y.o. female that is presenting with congestion and cough.  The congestion has been present for 5 days.  She was reported having a measured temperature of 100 degrees.  Feels like her symptoms been got progressively worse this past week.  Has had limited improvement with over-the-counter remedies.  Has been around others with similar symptoms.  Denies any travel.  Denies any production with the cough.  The cough started last night.   Review of Systems  Constitutional: Positive for fever.  HENT: Positive for congestion and sinus pressure.   Respiratory: Positive for cough.   Cardiovascular: Negative for chest pain.  Gastrointestinal: Negative for abdominal pain.    HISTORY: Past Medical, Surgical, Social, and Family History Reviewed & Updated per EMR.   Pertinent Historical Findings include:  Past Medical History:  Diagnosis Date  . Anxiety   . Autism   . Depression   . Heart murmur   . Hyperlipidemia   . Hypertension   . Renal disorder     No past surgical history on file.  Allergies  Allergen Reactions  . Amoxicillin Other (See Comments)    Bloody diarrhea  Has patient had a PCN reaction causing immediate rash, facial/tongue/throat swelling, SOB or lightheadedness with hypotension: No Has patient had a PCN reaction causing severe rash involving mucus membranes or skin necrosis: No Has patient had a PCN reaction that required hospitalization: No Has patient had a PCN reaction occurring within the last 10 years: No If all of the above answers are "NO", then may proceed with Cephalosporin use.   . Sulfur Other (See Comments)    Nausea, vomiting, abdominal cramping  . Celexa [Citalopram Hydrobromide] Rash    Family History  Problem Relation Age of Onset  . Heart disease Father   . Hypertension  Father   . Other Father        idiopathic pulmonary fibrosis     Social History   Socioeconomic History  . Marital status: Single    Spouse name: Not on file  . Number of children: Not on file  . Years of education: Not on file  . Highest education level: Not on file  Occupational History  . Not on file  Social Needs  . Financial resource strain: Not on file  . Food insecurity:    Worry: Not on file    Inability: Not on file  . Transportation needs:    Medical: Not on file    Non-medical: Not on file  Tobacco Use  . Smoking status: Never Smoker  . Smokeless tobacco: Never Used  Substance and Sexual Activity  . Alcohol use: No  . Drug use: No  . Sexual activity: Never  Lifestyle  . Physical activity:    Days per week: Not on file    Minutes per session: Not on file  . Stress: Not on file  Relationships  . Social connections:    Talks on phone: Not on file    Gets together: Not on file    Attends religious service: Not on file    Active member of club or organization: Not on file    Attends meetings of clubs or organizations: Not on file    Relationship status: Not on file  . Intimate partner violence:    Fear of current  or ex partner: Not on file    Emotionally abused: Not on file    Physically abused: Not on file    Forced sexual activity: Not on file  Other Topics Concern  . Not on file  Social History Narrative  . Not on file     PHYSICAL EXAM:  VS: BP 124/86   Pulse 61   Temp 98.3 F (36.8 C) (Oral)   Resp 16   Ht 5\' 4"  (1.626 m)   Wt 161 lb (73 kg)   SpO2 98%   BMI 27.64 kg/m  Physical Exam Gen: NAD, alert, cooperative with exam,  ENT: normal lips, normal nasal mucosa, tympanic membranes clear and intact bilaterally, normal oropharynx, no cervical lymphadenopathy Eye: normal EOM, normal conjunctiva and lids CV:  no edema, +2 pedal pulses, regular rate and rhythm, S1-S2   Resp: no accessory muscle use, non-labored, clear to auscultation  bilaterally, no crackles or wheezes Skin: no rashes, no areas of induration  Neuro: normal tone, normal sensation to touch Psych:  normal insight, alert and oriented MSK: Normal gait, normal strength       ASSESSMENT & PLAN:   Acute non-recurrent frontal sinusitis Likely viral. Has been ongoing for about a week. Lungs sound clear  - counseled on supportive care - azithro provided if no improvement  - given indications to follow up.

## 2019-03-13 ENCOUNTER — Ambulatory Visit: Payer: BLUE CROSS/BLUE SHIELD | Admitting: Internal Medicine

## 2019-04-10 ENCOUNTER — Ambulatory Visit: Payer: BC Managed Care – PPO | Admitting: Internal Medicine

## 2019-04-10 ENCOUNTER — Ambulatory Visit: Payer: Self-pay | Admitting: Family Medicine

## 2019-04-10 ENCOUNTER — Ambulatory Visit
Admission: RE | Admit: 2019-04-10 | Discharge: 2019-04-10 | Disposition: A | Discharge status: Home / Self Care | Payer: BC Managed Care – PPO | Source: Ambulatory Visit | Attending: Family Medicine | Admitting: Family Medicine

## 2019-04-10 ENCOUNTER — Other Ambulatory Visit: Payer: Self-pay

## 2019-04-10 ENCOUNTER — Encounter: Payer: Self-pay | Admitting: Gastroenterology

## 2019-04-10 ENCOUNTER — Encounter: Payer: Self-pay | Admitting: Family Medicine

## 2019-04-10 ENCOUNTER — Ambulatory Visit: Payer: BC Managed Care – PPO | Admitting: Family Medicine

## 2019-04-10 VITALS — BP 122/80 | HR 62 | Temp 98.9°F | Ht 63.5 in | Wt 162.0 lb

## 2019-04-10 DIAGNOSIS — F840 Autistic disorder: Secondary | ICD-10-CM

## 2019-04-10 DIAGNOSIS — F329 Major depressive disorder, single episode, unspecified: Secondary | ICD-10-CM

## 2019-04-10 DIAGNOSIS — M25512 Pain in left shoulder: Secondary | ICD-10-CM

## 2019-04-10 DIAGNOSIS — K219 Gastro-esophageal reflux disease without esophagitis: Secondary | ICD-10-CM | POA: Insufficient documentation

## 2019-04-10 DIAGNOSIS — M25619 Stiffness of unspecified shoulder, not elsewhere classified: Secondary | ICD-10-CM

## 2019-04-10 DIAGNOSIS — G8929 Other chronic pain: Secondary | ICD-10-CM

## 2019-04-10 DIAGNOSIS — Z862 Personal history of diseases of the blood and blood-forming organs and certain disorders involving the immune mechanism: Secondary | ICD-10-CM

## 2019-04-10 DIAGNOSIS — F32A Depression, unspecified: Secondary | ICD-10-CM

## 2019-04-10 DIAGNOSIS — R1310 Dysphagia, unspecified: Secondary | ICD-10-CM

## 2019-04-10 DIAGNOSIS — F419 Anxiety disorder, unspecified: Secondary | ICD-10-CM

## 2019-04-10 DIAGNOSIS — M19012 Primary osteoarthritis, left shoulder: Secondary | ICD-10-CM | POA: Diagnosis not present

## 2019-04-10 DIAGNOSIS — R1319 Other dysphagia: Secondary | ICD-10-CM

## 2019-04-10 DIAGNOSIS — Z23 Encounter for immunization: Secondary | ICD-10-CM

## 2019-04-10 MED ORDER — MELOXICAM 7.5 MG PO TABS: 7.5000 mg | ORAL_TABLET | Freq: Every day | ORAL | 0 refills | Status: DC

## 2019-04-10 MED ORDER — DICLOFENAC SODIUM 1 % EX GEL: 2.0000 g | Freq: Four times a day (QID) | CUTANEOUS | 1 refills | Status: DC

## 2019-04-10 NOTE — Patient Instructions (Signed)
You can call to schedule your appointment with the psychiatrist/counselor. A few offices are listed below for you to call.      [facility]  [place], [place], [place] [address]  Phone: [phone]  [place]  Ask for a psychiatrist  [place]  (across from [facility])  [facility] [address] [place], [place] [address] [place] [address] [place],  [address]  Phone: [phone]

## 2019-04-10 NOTE — Progress Notes (Signed)
Subjective:    Patient ID: [name], female    DOB: 1961/03/13, 58 y.o.   MRN: [id number]  HPI Chief Complaint  Patient presents with  . new pt    new pt get established. left arm pain. wants flu shot   She is new to the practice and here to establish care. Lived in [place] for years  Previous medical care: no regular care since 2015. [facility]   Other providers: None   States she is overdue on preventive health care and we can update this at her convenience.   Complains of left upper arm pain, mainly anterior as well as limited ROM of her left shoulder since July. States she is not sure if she injured it but she did trip over the vacuum cord and hit her left arm on the door frame.  Reports intermittent numbness and weakness of her left arm since July.   She has tried menthol patches but states she does not tolerate them. States she "can't breathe with them on".  States she takes Tylenol 500 mg 1-2 times per day some days and this does not help.  States she has not taken any anti-inflammatories.   States she has severe reflux, gets choked on food. Takes Tums. States her mother has had to do the heimlich on her before and she has had difficulty swallowing her on saliva at times. Has not seen GI for this. Reports having to have an upper EGD when she was young.    States she has a history of low platelets and was advised to follow up with a hematologist but has not.   History of insomnia which has improved significantly. It was worse after her father passed 3 years ago. States she has not worked through the grief of his death.  Reports history of anxiety, depression and autism.  She has taken medications in the past. Is interested in establishing with a psychiatrist and therapist here.  Denies any thoughts of suicide.   Denies alcohol, smoking or drug use   LMP: age 76   Social history: Lives with her mother, works from home for [employer]   Excerise:  none lately   Immunizations: would like a flu shot    Reviewed allergies, medications, past medical, surgical, family, and social history.   Review of Systems Pertinent positives and negatives in the history of present illness.     Objective:   Physical Exam BP 122/80   Pulse 62   Temp 98.9 F (37.2 C)   Ht 5' 3.5" (1.613 m)   Wt 162 lb (73.5 kg)   BMI 28.25 kg/m   Alert and oriented and in no distress.  Neck is supple.  Cardiac exam shows a regular rhythm without murmurs or gallops. Lungs are clear to auscultation. Abdomen is soft, non distended, normal BS, non tender, no palpable masses. Extremities without edema. Left shoulder with limited ROM, AC joint is non tender, negative Neer's and Hawkins. Left arm with normal color, sensation, motion, pulses but is weaker than right.  Skin is warm and dry. Normal speech.        Assessment & Plan:  Chronic left shoulder pain - Plan: DG Shoulder Left, meloxicam (MOBIC) 7.5 MG tablet, diclofenac Sodium (VOLTAREN) 1 % GEL, Ambulatory referral to Physical Therapy -concerning exam of her left shoulder and left arm. Will send her for an X ray of her left shoulder. Start on meloxicam and Voltaren gel. Refer to PT. Offered referral  to ortho and we can do this if she is seeing improvement.   Limited range of motion (ROM) of shoulder - Plan: DG Shoulder Left, Ambulatory referral to Physical Therapy-see above note  Neurovascularly intact   Anxiety and depression - Plan: Comprehensive metabolic panel, TSH -complex mental health history and the death of her father seemed to worsen her depression and anxiety. No thoughts of self harm. She will call and schedule with a psychiatrist and therapist. A list was provided.   Autism- this was documented in her history and she does verbalize this diagnosis.   Esophageal dysphagia - Plan: Ambulatory referral to Gastroenterology -refer to GI for further evaluation   Needs flu shot - Plan: Flu Vaccine QUAD  36+ mos PF IM (Fluarix) -aware of potential side effects   History of thrombocytopenia - Plan: CBC with Differential, Comprehensive metabolic panel Check labs and follow up   Gastroesophageal reflux disease, unspecified whether esophagitis present - Plan: Ambulatory referral to Gastroenterology Taking Tums. May try a PPI. Refer to GI  Spent 45 minutes face to face with patient and at more than 50% was in counseling and coordination of care.

## 2019-04-11 LAB — COMPREHENSIVE METABOLIC PANEL
ALT: 20 IU/L (ref 0–32)
AST: 21 IU/L (ref 0–40)
Albumin/Globulin Ratio: 2.5 — ABNORMAL HIGH (ref 1.2–2.2)
Albumin: 4.8 g/dL (ref 3.8–4.9)
Alkaline Phosphatase: 110 IU/L (ref 39–117)
BUN/Creatinine Ratio: 14 (ref 9–23)
BUN: 13 mg/dL (ref 6–24)
Bilirubin Total: 0.6 mg/dL (ref 0.0–1.2)
CO2: 24 mmol/L (ref 20–29)
Calcium: 10.2 mg/dL (ref 8.7–10.2)
Chloride: 102 mmol/L (ref 96–106)
Creatinine, Ser: 0.92 mg/dL (ref 0.57–1.00)
GFR calc Af Amer: 80 mL/min/{1.73_m2} (ref >59)
GFR calc non Af Amer: 69 mL/min/{1.73_m2} (ref >59)
Globulin, Total: 1.9 g/dL (ref 1.5–4.5)
Glucose: 80 mg/dL (ref 65–99)
Potassium: 4 mmol/L (ref 3.5–5.2)
Sodium: 141 mmol/L (ref 134–144)
Total Protein: 6.7 g/dL (ref 6.0–8.5)

## 2019-04-11 LAB — CBC WITH DIFFERENTIAL/PLATELET
Basophils Absolute: 0.1 10*3/uL (ref 0.0–0.2)
Basos: 2 %
EOS (ABSOLUTE): 0.3 10*3/uL (ref 0.0–0.4)
Eos: 5 %
Hematocrit: 45.7 % (ref 34.0–46.6)
Hemoglobin: 15.3 g/dL (ref 11.1–15.9)
Immature Grans (Abs): 0 10*3/uL (ref 0.0–0.1)
Immature Granulocytes: 1 %
Lymphocytes Absolute: 1.7 10*3/uL (ref 0.7–3.1)
Lymphs: 26 %
MCH: 30.4 pg (ref 26.6–33.0)
MCHC: 33.5 g/dL (ref 31.5–35.7)
MCV: 91 fL (ref 79–97)
Monocytes Absolute: 0.6 10*3/uL (ref 0.1–0.9)
Monocytes: 9 %
Neutrophils Absolute: 3.9 10*3/uL (ref 1.4–7.0)
Neutrophils: 57 %
Platelets: 230 10*3/uL (ref 150–450)
RBC: 5.04 x10E6/uL (ref 3.77–5.28)
RDW: 12.1 % (ref 11.7–15.4)
WBC: 6.7 10*3/uL (ref 3.4–10.8)

## 2019-04-11 LAB — TSH: TSH: 1.95 u[IU]/mL (ref 0.450–4.500)

## 2019-05-05 ENCOUNTER — Ambulatory Visit: Payer: BC Managed Care – PPO | Admitting: Family Medicine

## 2019-05-07 ENCOUNTER — Other Ambulatory Visit: Payer: Self-pay | Admitting: Family Medicine

## 2019-05-07 DIAGNOSIS — G8929 Other chronic pain: Secondary | ICD-10-CM

## 2019-05-07 DIAGNOSIS — M25512 Pain in left shoulder: Secondary | ICD-10-CM

## 2019-05-07 NOTE — Telephone Encounter (Signed)
Please check and see if she is still taking this on a daily basis.  How often is she taking it and how is her pain now?

## 2019-05-07 NOTE — Telephone Encounter (Signed)
Left message for pt to call me back 

## 2019-05-07 NOTE — Telephone Encounter (Signed)
Is this okay to refill? 

## 2019-05-08 NOTE — Telephone Encounter (Signed)
Left message for pt to call me back 

## 2019-05-09 ENCOUNTER — Telehealth: Payer: Self-pay | Admitting: Family Medicine

## 2019-05-09 NOTE — Telephone Encounter (Signed)
Pt called back and states that she is taking medication daily. One in the morning and she is not any better. Pt can be reached at [phone].

## 2019-05-09 NOTE — Telephone Encounter (Signed)
If she is referring to her shoulder pain, please give her the number to the orthopedic urgent care that is open in the evening. I believe this is [facility] or [address]? Please confirm and let her know. Let her know that I am not in the office until Wednesday. If she wants to wait and see someone next week, perhaps one of the other providers could see her Monday or Tuesday.

## 2019-05-09 NOTE — Telephone Encounter (Signed)
Left detailed message on machine for patient. To contact [name] urgent [facility] and let us know if she need a sooner appointment with another provider in our office before Wednesday.

## 2019-05-15 ENCOUNTER — Ambulatory Visit: Payer: 59 | Admitting: Gastroenterology

## 2019-05-15 ENCOUNTER — Encounter: Payer: Self-pay | Admitting: Gastroenterology

## 2019-05-15 VITALS — BP 122/80 | HR 60 | Temp 97.6°F | Ht 63.5 in | Wt 164.4 lb

## 2019-05-15 DIAGNOSIS — Z01818 Encounter for other preprocedural examination: Secondary | ICD-10-CM | POA: Diagnosis not present

## 2019-05-15 DIAGNOSIS — K219 Gastro-esophageal reflux disease without esophagitis: Secondary | ICD-10-CM

## 2019-05-15 DIAGNOSIS — R1310 Dysphagia, unspecified: Secondary | ICD-10-CM

## 2019-05-15 MED ORDER — SUPREP BOWEL PREP KIT 17.5-3.13-1.6 GM/177ML PO SOLN: 1.0000 | ORAL | 0 refills | Status: DC

## 2019-05-15 MED ORDER — PANTOPRAZOLE SODIUM 40 MG PO TBEC: 40.0000 mg | DELAYED_RELEASE_TABLET | ORAL | 3 refills | Status: DC

## 2019-05-15 NOTE — Progress Notes (Signed)
Referring Provider: Girtha Rm, NP-C Primary Care Physician:  Girtha Rm, NP-C  Reason for Consultation:  Dysphagia   IMPRESSION:  Reflux with intermittent dysphagia to solids, liquids including her own saliva     - symptoms temporally associated with weight gain Small hiatal hernia History of intolerance to sedation during prior EGD History of thrombocytopenia - ? Due to H2Blocker Need for colon cancer screening No known family history of colon cancer or polyps Rectal inflammation on colonoscopy with Dr. Fuller Plan 1996   PLAN: - Reviewed lifestyle modifications, particularly working to maintain a healthy weight - Pantoprazole 40 mg QAM - EGD +/- dilation  to evaluate for esophagitis including reflux esophagitis and eosinophilic esophagitis, ring, web, or stricture - Screening Colonoscopy  The nature of the procedure, as well as the risks, benefits, and alternatives were carefully and thoroughly reviewed with the patient. Ample time for discussion and questions allowed. The patient understood, was satisfied, and agreed to proceed.  Please see the "Patient Instructions" section for addition details about the plan.  HPI: [name] is a 59 y.o. female referred by NP [facility] for reflux and dysphagia. The history is obtained through the patient and review of her electronic health record. History of anxiety, depression, autism, medication-induced thrombocytopenia although recent platelets were normal. Works from home for [employer].   Father died three years ago Associated anxiety and weight gain of 40 pounds after his death Significant change in her diet that occurred then Heartburn started at that time While eating feels like something is stuck in the back of her throat Associated brash and sense of choking Deep breaths and relaxing can help Heimlich maneuver must be performed by her mother occasionally No cough, chest pain, dysphonia, neck pain, sore  throat No changes in bowel habits, no blood in the stool Can minimize symptoms with very small bites of food.  She is afraid to eat out due to the symptoms No other associated symptoms. No identified exacerbating or relieving features.   Unable to take Zantac due to history of thrombocytopenia.   Labs 04/10/19: normal CMP, WBC 6.7, hgb 15.3, MCV 91, RDW 12.1, platelets 230, TSH 1.950  No recent abdominal imaging. No recent endoscopy. No colon cancer screening.   Colonoscopy with Dr. Fuller Plan 1996, which [name] does not remember, showed mild rectal inflammation. EGD in 1996 showed gastritis and a small hiatal hernia. She awoke uncomfortably during that procedure.   No known family history of colon cancer or polyps. No family history of uterine/endometrial cancer, pancreatic cancer or gastric/stomach cancer.   Past Medical History:  Diagnosis Date  . Anxiety   . Autism   . Depression   . Heart murmur   . Hyperlipidemia   . Hypertension   . Renal disorder     No past surgical history on file.  Current Outpatient Medications  Medication Sig Dispense Refill  . BIOTIN PO Take by mouth.    . Calcium-Vitamin D-Vitamin K 650-12.5-40 MG-MCG-MCG CHEW Chew 1 tablet by mouth daily.    . cetirizine (ZYRTEC) 10 MG tablet Take 1 tablet (10 mg total) by mouth daily.    . diclofenac Sodium (VOLTAREN) 1 % GEL Apply 2 g topically 4 (four) times daily. 150 g 1  . Flaxseed, Linseed, (FLAX SEEDS PO) Take by mouth.    Marland Kitchen MAGNESIUM CITRATE PO Take by mouth.    . meloxicam (MOBIC) 7.5 MG tablet Take 1 tablet (7.5 mg total) by mouth daily. 30 tablet 0  .  vitamin B-12 (CYANOCOBALAMIN) 500 MCG tablet Take 500 mcg by mouth daily.    . vitamin E 400 UNIT capsule Take 400 Units by mouth daily.     No current facility-administered medications for this visit.    Allergies as of 05/15/2019 - Review Complete 04/10/2019  Allergen Reaction Noted  . Amoxicillin Other (See Comments) 02/14/2013  . Sulfur Other  (See Comments) 08/23/2012  . Celexa [citalopram hydrobromide] Rash 08/24/2015    Family History  Problem Relation Age of Onset  . Heart disease Father   . Hypertension Father   . Other Father        idiopathic pulmonary fibrosis    Social History   Socioeconomic History  . Marital status: Single    Spouse name: Not on file  . Number of children: Not on file  . Years of education: Not on file  . Highest education level: Not on file  Occupational History  . Not on file  Tobacco Use  . Smoking status: Never Smoker  . Smokeless tobacco: Never Used  Substance and Sexual Activity  . Alcohol use: No  . Drug use: No  . Sexual activity: Never  Other Topics Concern  . Not on file  Social History Narrative  . Not on file   Social Determinants of Health   Financial Resource Strain:   . Difficulty of Paying Living Expenses: Not on file  Food Insecurity:   . Worried About Programme researcher, broadcasting/film/video in the Last Year: Not on file  . Ran Out of Food in the Last Year: Not on file  Transportation Needs:   . Lack of Transportation (Medical): Not on file  . Lack of Transportation (Non-Medical): Not on file  Physical Activity:   . Days of Exercise per Week: Not on file  . Minutes of Exercise per Session: Not on file  Stress:   . Feeling of Stress : Not on file  Social Connections:   . Frequency of Communication with Friends and Family: Not on file  . Frequency of Social Gatherings with Friends and Family: Not on file  . Attends Religious Services: Not on file  . Active Member of Clubs or Organizations: Not on file  . Attends Banker Meetings: Not on file  . Marital Status: Not on file  Intimate Partner Violence:   . Fear of Current or Ex-Partner: Not on file  . Emotionally Abused: Not on file  . Physically Abused: Not on file  . Sexually Abused: Not on file    Review of Systems: 12 system ROS is negative except as noted above with the addition of anxiety, depression,  night sweats, and insomnia.   Physical Exam: General:   Alert,  well-nourished, pleasant and cooperative in NAD Head:  Normocephalic and atraumatic. Eyes:  Sclera clear, no icterus.   Conjunctiva pink. Ears:  Normal auditory acuity. Nose:  No deformity, discharge,  or lesions. Mouth:  No deformity or lesions.   Neck:  Supple; no masses or thyromegaly. No LAD.  Abdomen:  Soft, nontender, nondistended, normal bowel sounds, no rebound or guarding. No hepatosplenomegaly.   Rectal:  Deferred  Msk:  Symmetrical. No boney deformities LAD: No inguinal or umbilical LAD Extremities:  No clubbing or edema. Neurologic:  Alert and  oriented x4;  grossly nonfocal Skin: No rash or bruise.  Psych:  Alert and cooperative. Normal mood and affect.      Calub Tarnow L. Orvan Falconer, MD, MPH 05/15/2019, 9:13 AM

## 2019-05-15 NOTE — Patient Instructions (Addendum)
I have recommended an upper endoscopy with possible balloon dilation given your trouble swallowing. In the meantime, start taking pantoprazole 40 mg every morning 30-60 minutes prior to breakfast.  I have also recommended a colonoscopy for colon cancer screening.   Modifying diet and lifestyle remains the foundation for treating the symptoms of reflux.   The following strategies help you prevent heartburn and other symptoms by avoiding foods that reduce the effectiveness of the bottom of the esophagus from protecting the esophagus from from acid injury and keeping stomach contents where they belong.  Eat smaller meals. A large meal remains in the stomach for several hours, increasing the chances for gastroesophageal reflux. Try distributing your daily food intake over three, four, or five smaller meals.  Relax when you eat. Stress increases the production of stomach acid, so make meals a pleasant, relaxing experience. Sit down. Eat slowly. Chew completely. Play soothing music.  Relax between meals. Relaxation therapies such as deep breathing, meditation, massage, tai chi, or yoga may help prevent and relieve heartburn.   Remain upright after eating. You should maintain postures that reduce the risk for reflux for at least three hours after eating. For example, don't bend over or strain to lift heavy objects.  Avoid eating within three hours of going to bed. Lying down after eating will increase chances of reflux.  Lose weight. Excess pounds increase pressure on the stomach and can push acid into the esophagus.  Loosen up. Avoid tight belts, waistbands, and other clothing that puts pressure on your stomach.  Avoid foods that burn. Abstain from food or drink that increases gastric acid secretion, decreases the valve at the bottom of the esophagus, or slows the emptying of the stomach. Known offenders include high-fat foods, spicy dishes, tomatoes and tomato products, citrus fruits, garlic,  onions, milk, carbonated drinks, coffee (including decaf), tea, chocolate, mints, and alcohol.  Avoid medications that can predispose you to reflux including aspirin and other NSAIDs, oral contraceptives, hormone therapy drugs, and certain antidepressants.  Sleep at an angle. If you're bothered by nighttime heartburn, place a wedge (available in medical supply stores or a wedge pillow through [employer]) under your upper body. But don't elevate your head with extra pillows. That makes reflux worse by bending you at the waist and compressing your stomach. You might also try sleeping on your left side, as studies have shown this can help--perhaps because the stomach is on the left side of the body, so lying on your left positions most of the stomach below the bottom of the esophagus.    If you are age 58 or older, your body mass index should be between 23-30. Your Body mass index is 28.66 kg/m. If this is out of the aforementioned range listed, please consider follow up with your Primary Care Provider.  If you are age 58 or younger, your body mass index should be between 19-25. Your Body mass index is 28.66 kg/m. If this is out of the aformentioned range listed, please consider follow up with your Primary Care Provider.   We have sent the following medications to your pharmacy for you to pick up at your convenience:  START: Pantoprazole 40mg take one tablet every morning  You have been scheduled for an endoscopy and colonoscopy. Please follow the written instructions given to you at your visit today. Please pick up your prep supplies at the pharmacy within the next 1-3 days. If you use inhalers (even only as needed), please bring them with you on   the day of your procedure.  Due to recent changes in healthcare laws, you may see the results of your imaging and laboratory studies on MyChart before your provider has had a chance to review them.  We understand that in some cases there may be results  that are confusing or concerning to you. Not all laboratory results come back in the same time frame and the provider may be waiting for multiple results in order to interpret others.  Please give [place] 48 hours in order for your provider to thoroughly review all the results before contacting the office for clarification of your results.   Food Choices for Gastroesophageal Reflux Disease, Adult When you have gastroesophageal reflux disease (GERD), the foods you eat and your eating habits are very important. Choosing the right foods can help ease your discomfort. Think about working with a nutrition specialist (dietitian) to help you make good choices. What are tips for following this plan?  Meals  Choose healthy foods that are low in fat, such as fruits, vegetables, whole grains, low-fat dairy products, and lean meat, fish, and poultry.  Eat small meals often instead of 3 large meals a day. Eat your meals slowly, and in a place where you are relaxed. Avoid bending over or lying down until 2-3 hours after eating.  Avoid eating meals 2-3 hours before bed.  Avoid drinking a lot of liquid with meals.  Cook foods using methods other than frying. Bake, grill, or broil food instead.  Avoid or limit: ? Chocolate. ? Peppermint or spearmint. ? Alcohol. ? Pepper. ? Black and decaffeinated coffee. ? Black and decaffeinated tea. ? Bubbly (carbonated) soft drinks. ? Caffeinated energy drinks and soft drinks.  Limit high-fat foods such as: ? Fatty meat or fried foods. ? Whole milk, cream, butter, or ice cream. ? Nuts and nut butters. ? Pastries, donuts, and sweets made with butter or shortening.  Avoid foods that cause symptoms. These foods may be different for everyone. Common foods that cause symptoms include: ? Tomatoes. ? Oranges, lemons, and limes. ? Peppers. ? Spicy food. ? Onions and garlic. ? Vinegar. Lifestyle  Maintain a healthy weight. Ask your doctor what weight is healthy for  you. If you need to lose weight, work with your doctor to do so safely.  Exercise for at least 30 minutes for 5 or more days each week, or as told by your doctor.  Wear loose-fitting clothes.  Do not smoke. If you need help quitting, ask your doctor.  Sleep with the head of your bed higher than your feet. Use a wedge under the mattress or blocks under the bed frame to raise the head of the bed. Summary  When you have gastroesophageal reflux disease (GERD), food and lifestyle choices are very important in easing your symptoms.  Eat small meals often instead of 3 large meals a day. Eat your meals slowly, and in a place where you are relaxed.  Limit high-fat foods such as fatty meat or fried foods.  Avoid bending over or lying down until 2-3 hours after eating.  Avoid peppermint and spearmint, caffeine, alcohol, and chocolate. This information is not intended to replace advice given to you by your health care provider. Make sure you discuss any questions you have with your health care provider. Document Revised: 08/08/2018 Document Reviewed: 05/23/2016 Elsevier Patient Education  [place].

## 2019-05-21 ENCOUNTER — Ambulatory Visit: Payer: BC Managed Care – PPO | Admitting: Family Medicine

## 2019-06-20 ENCOUNTER — Encounter: Payer: 59 | Admitting: Gastroenterology

## 2019-08-28 ENCOUNTER — Ambulatory Visit: Payer: 59 | Attending: Internal Medicine

## 2019-08-28 DIAGNOSIS — Z23 Encounter for immunization: Secondary | ICD-10-CM

## 2019-08-28 NOTE — Progress Notes (Signed)
   Covid-19 Vaccination Clinic  Name:  [name]    MRN: [id number] DOB: 02-15-1961  08/28/2019  [name] was observed post Covid-19 immunization for 15 minutes without incident. She was provided with Vaccine Information Sheet and instruction to access the V-Safe system.   [name] was instructed to call 911 with any severe reactions post vaccine: Marland Kitchen Difficulty breathing  . Swelling of face and throat  . A fast heartbeat  . A bad rash all over body  . Dizziness and weakness   Immunizations Administered    Name Date Dose VIS Date Route   Pfizer COVID-19 Vaccine 08/28/2019  3:20 PM 0.3 mL 06/25/2018 Intramuscular   Manufacturer: [employer], [employer]   Lot: [id number]   NDC: [address]-6

## 2019-09-18 ENCOUNTER — Ambulatory Visit: Payer: 59

## 2019-09-20 ENCOUNTER — Ambulatory Visit: Payer: 59 | Attending: Internal Medicine

## 2019-09-20 DIAGNOSIS — Z23 Encounter for immunization: Secondary | ICD-10-CM

## 2019-09-20 NOTE — Progress Notes (Signed)
   Covid-19 Vaccination Clinic  Name:  Azariah Latendresse    MRN: [id number] DOB: 1960/06/25  09/20/2019  Ms. Iwata was observed post Covid-19 immunization for 15 minutes without incident. She was provided with Vaccine Information Sheet and instruction to access the V-Safe system.   Ms. Argueta was instructed to call 911 with any severe reactions post vaccine: Marland Kitchen Difficulty breathing  . Swelling of face and throat  . A fast heartbeat  . A bad rash all over body  . Dizziness and weakness   Immunizations Administered    Name Date Dose VIS Date Route   Pfizer COVID-19 Vaccine 09/20/2019 11:49 AM 0.3 mL 06/25/2018 Intramuscular   Manufacturer: [employer], [employer]   Lot: [id number]   NDC: [address]-2

## 2019-10-09 ENCOUNTER — Telehealth: Payer: Self-pay | Admitting: General Surgery

## 2019-10-09 NOTE — Telephone Encounter (Signed)
The pharmacy requested a prior authorization to be obtained for the patient for Suprep. The patient cancelled her appointment and will not require this prep. Notified the pharmacy via fax

## 2020-08-23 ENCOUNTER — Emergency Department (HOSPITAL_COMMUNITY): Payer: Self-pay

## 2020-08-23 ENCOUNTER — Emergency Department (HOSPITAL_COMMUNITY)
Admission: EM | Admit: 2020-08-23 | Discharge: 2020-08-23 | Disposition: A | Discharge status: Home / Self Care | Payer: Self-pay | Attending: Emergency Medicine | Admitting: Emergency Medicine

## 2020-08-23 ENCOUNTER — Encounter (HOSPITAL_COMMUNITY): Payer: Self-pay

## 2020-08-23 ENCOUNTER — Other Ambulatory Visit: Payer: Self-pay

## 2020-08-23 DIAGNOSIS — I10 Essential (primary) hypertension: Secondary | ICD-10-CM | POA: Insufficient documentation

## 2020-08-23 DIAGNOSIS — F840 Autistic disorder: Secondary | ICD-10-CM | POA: Insufficient documentation

## 2020-08-23 DIAGNOSIS — N23 Unspecified renal colic: Secondary | ICD-10-CM | POA: Insufficient documentation

## 2020-08-23 LAB — COMPREHENSIVE METABOLIC PANEL
ALT: 16 U/L (ref 0–44)
AST: 22 U/L (ref 15–41)
Albumin: 4 g/dL (ref 3.5–5.0)
Alkaline Phosphatase: 93 U/L (ref 38–126)
Anion gap: 10 (ref 5–15)
BUN: 12 mg/dL (ref 6–20)
CO2: 26 mmol/L (ref 22–32)
Calcium: 9.7 mg/dL (ref 8.9–10.3)
Chloride: 105 mmol/L (ref 98–111)
Creatinine, Ser: 0.97 mg/dL (ref 0.44–1.00)
GFR, Estimated: >60 mL/min (ref >60)
Glucose, Bld: 126 mg/dL — ABNORMAL HIGH (ref 70–99)
Potassium: 4 mmol/L (ref 3.5–5.1)
Sodium: 141 mmol/L (ref 135–145)
Total Bilirubin: 0.4 mg/dL (ref 0.3–1.2)
Total Protein: 6.7 g/dL (ref 6.5–8.1)

## 2020-08-23 LAB — CBC WITH DIFFERENTIAL/PLATELET
Abs Immature Granulocytes: 0.04 10*3/uL (ref 0.00–0.07)
Basophils Absolute: 0.1 10*3/uL (ref 0.0–0.1)
Basophils Relative: 1 %
Eosinophils Absolute: 0.3 10*3/uL (ref 0.0–0.5)
Eosinophils Relative: 2 %
HCT: 46.1 % — ABNORMAL HIGH (ref 36.0–46.0)
Hemoglobin: 15.3 g/dL — ABNORMAL HIGH (ref 12.0–15.0)
Immature Granulocytes: 0 %
Lymphocytes Relative: 11 %
Lymphs Abs: 1.4 10*3/uL (ref 0.7–4.0)
MCH: 29.8 pg (ref 26.0–34.0)
MCHC: 33.2 g/dL (ref 30.0–36.0)
MCV: 89.9 fL (ref 80.0–100.0)
Monocytes Absolute: 0.6 10*3/uL (ref 0.1–1.0)
Monocytes Relative: 5 %
Neutro Abs: 10.3 10*3/uL — ABNORMAL HIGH (ref 1.7–7.7)
Neutrophils Relative %: 81 %
Platelets: 225 10*3/uL (ref 150–400)
RBC: 5.13 MIL/uL — ABNORMAL HIGH (ref 3.87–5.11)
RDW: 12.5 % (ref 11.5–15.5)
WBC: 12.7 10*3/uL — ABNORMAL HIGH (ref 4.0–10.5)
nRBC: 0 % (ref 0.0–0.2)

## 2020-08-23 LAB — URINALYSIS, ROUTINE W REFLEX MICROSCOPIC
Bilirubin Urine: NEGATIVE
Glucose, UA: NEGATIVE mg/dL
Ketones, ur: NEGATIVE mg/dL
Nitrite: NEGATIVE
Protein, ur: 100 mg/dL — AB
RBC / HPF: >50 RBC/hpf — ABNORMAL HIGH (ref 0–5)
Specific Gravity, Urine: 1.013 (ref 1.005–1.030)
pH: 6 (ref 5.0–8.0)

## 2020-08-23 LAB — LIPASE, BLOOD: Lipase: 32 U/L (ref 11–51)

## 2020-08-23 MED ORDER — MORPHINE SULFATE (PF) 4 MG/ML IV SOLN
4.0000 mg | Freq: Once | INTRAVENOUS | Status: AC
  Administered 2020-08-23: 4 mg via INTRAVENOUS
  Filled 2020-08-23: qty 1

## 2020-08-23 MED ORDER — HYDROCODONE-ACETAMINOPHEN 5-325 MG PO TABS: 1.0000 | ORAL_TABLET | Freq: Four times a day (QID) | ORAL | 0 refills | Status: DC | PRN

## 2020-08-23 MED ORDER — SODIUM CHLORIDE 0.9 % IV BOLUS
1000.0000 mL | Freq: Once | INTRAVENOUS | Status: AC
  Administered 2020-08-23: 1000 mL via INTRAVENOUS

## 2020-08-23 MED ORDER — ONDANSETRON 4 MG PO TBDP: 4.0000 mg | ORAL_TABLET | Freq: Three times a day (TID) | ORAL | 0 refills | Status: DC | PRN

## 2020-08-23 MED ORDER — IBUPROFEN 600 MG PO TABS: 600.0000 mg | ORAL_TABLET | Freq: Three times a day (TID) | ORAL | 0 refills | Status: DC | PRN

## 2020-08-23 MED ORDER — ONDANSETRON 4 MG PO TBDP
4.0000 mg | ORAL_TABLET | Freq: Once | ORAL | Status: AC
  Administered 2020-08-23: 4 mg via ORAL
  Filled 2020-08-23: qty 1

## 2020-08-23 MED ORDER — KETOROLAC TROMETHAMINE 15 MG/ML IJ SOLN
15.0000 mg | Freq: Once | INTRAMUSCULAR | Status: AC
  Administered 2020-08-23: 15 mg via INTRAVENOUS
  Filled 2020-08-23: qty 1

## 2020-08-23 NOTE — Discharge Instructions (Addendum)
Please return for any problem.  °

## 2020-08-23 NOTE — ED Provider Notes (Signed)
[facility] EMERGENCY DEPARTMENT Provider Note   CSN: [id number] Arrival date & time: 08/23/20  1045     History Chief Complaint  Patient presents with  . Abdominal Pain    [name] is a 60 y.o. female.  60 year old female with prior medical history as detailed below presents for evaluation.  Patient reports prior history of renal colic and renal stones.  Patient denies any prior intervention by urology.  Patient complains of approximately 5 to 7 days of right flank discomfort.  Patient's pain is consistent with prior episodes of renal colic.  However, patient reports that her symptoms have not resolved as quickly as normal.  She denies associated fever.  She denies nausea or vomiting.  She denies other complaint.  She is taking minimal to no medications at home for pain control.  The history is provided by the patient and medical records.  Flank Pain This is a new problem. The current episode started more than 2 days ago. The problem occurs constantly. The problem has not changed since onset.Pertinent negatives include no chest pain and no abdominal pain. Nothing aggravates the symptoms. Nothing relieves the symptoms.       Past Medical History:  Diagnosis Date  . Anxiety   . Autism   . Depression   . GERD (gastroesophageal reflux disease)   . Heart murmur   . Hyperlipidemia   . Hypertension   . Kidney stones   . Renal disorder     Patient Active Problem List   Diagnosis Date Noted  . History of thrombocytopenia 04/10/2019  . Esophageal dysphagia 04/10/2019  . Gastroesophageal reflux disease 04/10/2019  . Acute non-recurrent frontal sinusitis 07/05/2018  . Allergic rhinitis 08/24/2015  . Autism 08/24/2015  . Sleep difficulties 08/24/2015  . Depression 08/24/2015  . Anxiety 08/24/2015  . Elevated blood pressure 08/24/2015  . 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 NO  PAST SURGERIES       OB History   No obstetric history on file.     Family History  Problem Relation Age of Onset  . Heart disease Father   . Hypertension Father   . Other Father        idiopathic pulmonary fibrosis  . Liver cancer Paternal Grandmother     Social History   Tobacco Use  . Smoking status: Never Smoker  . Smokeless tobacco: Never Used  Vaping Use  . Vaping Use: Never used  Substance Use Topics  . Alcohol use: No  . Drug use: No    Home Medications Prior to Admission medications   Medication Sig Start Date End Date Taking? Authorizing Provider  Ascorbic Acid (VITAMIN C) 1000 MG tablet Take 1,000 mg by mouth daily.    [provider]  BIOTIN PO Take by mouth.    [provider]  Calcium-Vitamin D-Vitamin K 650-12.5-40 MG-MCG-MCG CHEW Chew 1 tablet by mouth daily.    [provider]  cetirizine (ZYRTEC) 10 MG tablet Take 1 tablet (10 mg total) by mouth daily. 08/24/15   Binnie Rail, MD  diclofenac Sodium (VOLTAREN) 1 % GEL Apply 2 g topically 4 (four) times daily. 04/10/19   Henson, Vickie L, NP-C  Flaxseed, Linseed, (FLAX SEEDS PO) Take by mouth.    [provider]  MAGNESIUM CITRATE PO Take by mouth.    [provider]  meloxicam (MOBIC) 7.5 MG tablet Take 1 tablet (7.5 mg total)  by mouth daily. 04/10/19   Henson, Vickie L, NP-C  Na Sulfate-K Sulfate-Mg Sulf (SUPREP BOWEL PREP KIT) 17.5-3.13-1.6 GM/177ML SOLN Take 1 kit by mouth as directed. 05/15/19   Thornton Park, MD  Omega-3 Fatty Acids (FISH OIL) 1200 MG CAPS Take 1 capsule by mouth daily.    [provider]  pantoprazole (PROTONIX) 40 MG tablet Take 1 tablet (40 mg total) by mouth every morning. Take 3- 60 mins before breakfast 05/15/19   Thornton Park, MD  vitamin B-12 (CYANOCOBALAMIN) 500 MCG tablet Take 500 mcg by mouth daily.    [provider]  vitamin E 400 UNIT capsule Take 400 Units by mouth daily.    [provider]     Allergies    Amoxicillin, Elemental sulfur, and Celexa [citalopram hydrobromide]  Review of Systems   Review of Systems  Cardiovascular: Negative for chest pain.  Gastrointestinal: Negative for abdominal pain.  Genitourinary: Positive for flank pain.  All other systems reviewed and are negative.   Physical Exam Updated Vital Signs BP (!) 151/97   Pulse 61   Temp 97.9 F (36.6 C) (Oral)   Resp 19   Ht '5\' 4"'  (1.626 m)   Wt 77.1 kg   SpO2 100%   BMI 29.18 kg/m   Physical Exam Vitals and nursing note reviewed.  Constitutional:      General: She is not in acute distress.    Appearance: She is well-developed.  HENT:     Head: Normocephalic and atraumatic.  Eyes:     Conjunctiva/sclera: Conjunctivae normal.     Pupils: Pupils are equal, round, and reactive to light.  Cardiovascular:     Rate and Rhythm: Normal rate and regular rhythm.     Heart sounds: Normal heart sounds.  Pulmonary:     Effort: Pulmonary effort is normal. No respiratory distress.     Breath sounds: Normal breath sounds.  Abdominal:     General: There is no distension.     Palpations: Abdomen is soft.     Tenderness: There is no abdominal tenderness.  Genitourinary:    Comments: Mild right CVAT  Musculoskeletal:        General: No deformity. Normal range of motion.     Cervical back: Normal range of motion and neck supple.  Skin:    General: Skin is warm and dry.  Neurological:     Mental Status: She is alert and oriented to person, place, and time.     ED Results / Procedures / Treatments   Labs (all labs ordered are listed, but only abnormal results are displayed) Labs Reviewed  CBC WITH DIFFERENTIAL/PLATELET - Abnormal; Notable for the following components:      Result Value   WBC 12.7 (*)    RBC 5.13 (*)    Hemoglobin 15.3 (*)    HCT 46.1 (*)    Neutro Abs 10.3 (*)    All other components within normal limits  COMPREHENSIVE METABOLIC PANEL - Abnormal; Notable for the following  components:   Glucose, Bld 126 (*)    All other components within normal limits  URINALYSIS, ROUTINE W REFLEX MICROSCOPIC - Abnormal; Notable for the following components:   APPearance HAZY (*)    Hgb urine dipstick LARGE (*)    Protein, ur 100 (*)    Leukocytes,Ua TRACE (*)    RBC / HPF >50 (*)    Bacteria, UA RARE (*)    All other components within normal limits  LIPASE, BLOOD  EKG None  Radiology CT Renal Stone Study  Result Date: 08/23/2020 CLINICAL DATA:  Left flank pain EXAM: CT ABDOMEN AND PELVIS WITHOUT CONTRAST TECHNIQUE: Multidetector CT imaging of the abdomen and pelvis was performed following the standard protocol without oral or IV contrast. COMPARISON:  August 23, 2012 FINDINGS: Lower chest: There is slight bibasilar scarring. No lung base edema or consolidation. There is a focal hiatal hernia. Hepatobiliary: There is a cyst near the dome of the liver on the right anteriorly measuring 1.4 x 1.2 cm. No other focal liver lesions are evident on this noncontrast enhanced study. Gallbladder wall is not appreciably thickened. There is no biliary duct dilatation. Pancreas: There is no pancreatic mass or inflammatory focus. Spleen: No splenic lesions are evident. Adrenals/Urinary Tract: Adrenals bilaterally appear normal. No appreciable renal masses. There is moderate hydronephrosis on the right. There is no appreciable hydronephrosis on the left. On the left, there is a calculus in a midpole calyx extending into the renal pelvis on the left measuring 1.9 x 1.1 cm. There are nearby 1-2 mm calculi in the mid left kidney. There is a calculus in the lower pole of the left kidney measuring 1.0 x 0.8 cm. There is a 1 mm calculus at the left ureterovesical junction. On the right, there is a calculus in the lower pole region measuring 5 4 mm. There is a nearby 2 mm calculus in this area. There is a calculus in the right renal pelvis extending to immediately superior to the ureteropelvic junction  on the right measuring 1.3 x 0.8 cm. The within the renal pelvis there is also a 6 x 6 mm calculus with an adjacent 2 x 1 mm calculus. More distally in the right ureter at the L3-4 level, there is a 4 x 3 mm calculus. Urinary bladder is midline with wall thickness within normal limits. Stomach/Bowel: There is no appreciable bowel wall or mesenteric thickening. There is moderate stool in the colon. No evident bowel obstruction. No free air or portal venous air. Terminal ileum appears normal. There is fatty infiltration in the ileocecal valve. Appendix appears normal. Vascular/Lymphatic: No abdominal aortic aneurysm. There are scattered foci of aortic and iliac artery atherosclerosis. No adenopathy is evident in the abdomen or pelvis. Reproductive: Uterus is anteverted. A tiny calcification is noted in the anterior uterus, a probable tiny leiomyoma. No adnexal masses are evident. Other: No abscess or ascites evident in the abdomen or pelvis. There is mild fat in the umbilicus. Musculoskeletal: There is degenerative change in the lower thoracic spine. No blastic or lytic bone lesions. No intramuscular lesions evident. IMPRESSION: 1. On the left, there is a calculus in a midpole calyx extending into the left renal pelvis measuring 1.9 x 1.1 cm. Several smaller calculi noted in the left kidney. There is a 1 mm calculus at the left ureterovesical junction. No appreciable hydronephrosis on the left. 2. On the right, there are calculi in an extrarenal pelvis on the right, largest measuring 1.3 x 0.8 cm. There is a 4 x 3 mm calculus at the L3-4 level in the right ureter. There is moderate hydronephrosis on the right. 3. No bowel wall thickening or bowel obstruction. No abscess in the abdomen or pelvis. Appendix appears normal. 4.  Moderate hiatal hernia. 5.  Aortic Atherosclerosis (ICD10-I70.0). Electronically Signed   By: Lowella Grip III M.D.   On: 08/23/2020 13:09    Procedures Procedures   Medications Ordered  in ED Medications  ondansetron (ZOFRAN-ODT) disintegrating tablet 4  mg (4 mg Oral Given 08/23/20 1125)  sodium chloride 0.9 % bolus 1,000 mL (1,000 mLs Intravenous New Bag/Given 08/23/20 1228)  morphine 4 MG/ML injection 4 mg (4 mg Intravenous Given 08/23/20 1222)  ketorolac (TORADOL) 15 MG/ML injection 15 mg (15 mg Intravenous Given 08/23/20 1352)    ED Course  I have reviewed the triage vital signs and the nursing notes.  Pertinent labs & imaging results that were available during my care of the patient were reviewed by me and considered in my medical decision making (see chart for details).    MDM Rules/Calculators/A&P                          MDM  MSE complete  [name] was evaluated in Emergency Department on 08/23/2020 for the symptoms described in the history of present illness. She was evaluated in the context of the global COVID-19 pandemic, which necessitated consideration that the patient might be at risk for infection with the SARS-CoV-2 virus that causes COVID-19. Institutional protocols and algorithms that pertain to the evaluation of patients at risk for COVID-19 are in a state of rapid change based on information released by regulatory bodies including the CDC and federal and state organizations. These policies and algorithms were followed during the patient's care in the ED.  Patient is presenting with right flank discomfort.  Her provided history and exam is suggestive of likely renal colic.  This diagnosis is confirmed with CT imaging.  Patient is right-sided hydronephrosis and right-sided ureteral stone.  Patient without left flank discomfort.  Patient without left sided hydronephrosis on CT.  The patient apparently has not seen a urologist since some time in the 1990s.  Dr. Jeffie Pollock (Urology) aware of case and will help arrange outpatient FU.  After administration of pain medicine in the ED she is now pain-free.  She does understand need for close follow-up.   Strict return precautions given and understood.     Final Clinical Impression(s) / ED Diagnoses Final diagnoses:  Renal colic    Rx / DC Orders ED Discharge Orders         Ordered    HYDROcodone-acetaminophen (NORCO/VICODIN) 5-325 MG tablet  Every 6 hours PRN        08/23/20 1447    ondansetron (ZOFRAN ODT) 4 MG disintegrating tablet  Every 8 hours PRN        08/23/20 1447    ibuprofen (ADVIL) 600 MG tablet  Every 8 hours PRN        08/23/20 1447           Valarie Merino, MD 08/23/20 1453

## 2020-08-23 NOTE — ED Triage Notes (Signed)
Emergency Medicine Provider Triage Evaluation Note  Kayce Carey Lafon , a 60 y.o. female  was evaluated in triage.  Pt complains of R flank pain.  Hx of kidney stone, R flank pain x 3 days, hematuria, nausea, pain is intense  Review of Systems  Positive: Hematuria, R flank pain, abd pain Negative: Fever, dysuria  Physical Exam  BP (!) 193/107 (BP Location: Left Arm)   Pulse 61   Temp 97.9 F (36.6 C) (Oral)   Resp 17   SpO2 99%  Gen:   Awake, no distress   HEENT:  Atraumatic  Resp:  Normal effort  Cardiac:  Normal rate  Abd:   RLQ tenderness, mild R CVA tenderness MSK:   Moves extremities without difficulty  Neuro:  Speech clear   Medical Decision Making  Medically screening exam initiated at 11:20 AM.  Appropriate orders placed.  Dot Emil Klassen was informed that the remainder of the evaluation will be completed by another provider, this initial triage assessment does not replace that evaluation, and the importance of remaining in the ED until their evaluation is complete.  Clinical Impression  R flank pain   Fayrene Helper, PA-C 08/23/20 1122

## 2020-08-23 NOTE — ED Triage Notes (Signed)
Pt reports she is here today due to LQ abd pain and right back pain.

## 2020-08-23 NOTE — ED Notes (Signed)
Discharge instructions discussed with pt. Pt verbalized understanding with no questions at this time. Pt to go home with mother.  

## 2020-08-26 ENCOUNTER — Telehealth: Payer: Self-pay | Admitting: Urology

## 2020-08-26 MED ORDER — ONDANSETRON HCL 4 MG PO TABS: 4.0000 mg | ORAL_TABLET | Freq: Three times a day (TID) | ORAL | 1 refills | Status: DC | PRN

## 2020-08-26 MED ORDER — TAMSULOSIN HCL 0.4 MG PO CAPS: 0.4000 mg | ORAL_CAPSULE | Freq: Every day | ORAL | Status: AC

## 2020-08-26 MED ORDER — KETOROLAC TROMETHAMINE 10 MG PO TABS: 10.0000 mg | ORAL_TABLET | Freq: Four times a day (QID) | ORAL | 0 refills | Status: DC | PRN

## 2020-08-26 NOTE — Telephone Encounter (Signed)
Patient seen in urology clinic for obstructing stone. Refills and new Rx sent to new pharmacy given insurance issues.  Patience Musca PGY4 URology

## 2020-11-29 ENCOUNTER — Ambulatory Visit: Payer: Self-pay | Admitting: Family Medicine

## 2020-12-03 ENCOUNTER — Encounter (HOSPITAL_COMMUNITY): Payer: Self-pay | Admitting: Emergency Medicine

## 2020-12-03 ENCOUNTER — Emergency Department (HOSPITAL_COMMUNITY)
Admission: EM | Admit: 2020-12-03 | Discharge: 2020-12-03 | Disposition: A | Discharge status: Home / Self Care | Payer: Self-pay | Attending: Emergency Medicine | Admitting: Emergency Medicine

## 2020-12-03 ENCOUNTER — Other Ambulatory Visit: Payer: Self-pay

## 2020-12-03 ENCOUNTER — Emergency Department (HOSPITAL_COMMUNITY): Payer: Self-pay

## 2020-12-03 DIAGNOSIS — I10 Essential (primary) hypertension: Secondary | ICD-10-CM | POA: Insufficient documentation

## 2020-12-03 DIAGNOSIS — R55 Syncope and collapse: Secondary | ICD-10-CM | POA: Insufficient documentation

## 2020-12-03 DIAGNOSIS — R5383 Other fatigue: Secondary | ICD-10-CM | POA: Insufficient documentation

## 2020-12-03 DIAGNOSIS — N94819 Vulvodynia, unspecified: Secondary | ICD-10-CM | POA: Insufficient documentation

## 2020-12-03 DIAGNOSIS — R0602 Shortness of breath: Secondary | ICD-10-CM | POA: Insufficient documentation

## 2020-12-03 DIAGNOSIS — Z20822 Contact with and (suspected) exposure to covid-19: Secondary | ICD-10-CM | POA: Insufficient documentation

## 2020-12-03 DIAGNOSIS — F840 Autistic disorder: Secondary | ICD-10-CM | POA: Insufficient documentation

## 2020-12-03 LAB — CBC WITH DIFFERENTIAL/PLATELET
Abs Immature Granulocytes: 0.03 K/uL (ref 0.00–0.07)
Basophils Absolute: 0.1 K/uL (ref 0.0–0.1)
Basophils Relative: 1 %
Eosinophils Absolute: 0.1 K/uL (ref 0.0–0.5)
Eosinophils Relative: 1 %
HCT: 41.9 % (ref 36.0–46.0)
Hemoglobin: 14.6 g/dL (ref 12.0–15.0)
Immature Granulocytes: 0 %
Lymphocytes Relative: 18 %
Lymphs Abs: 1.8 K/uL (ref 0.7–4.0)
MCH: 30.5 pg (ref 26.0–34.0)
MCHC: 34.8 g/dL (ref 30.0–36.0)
MCV: 87.7 fL (ref 80.0–100.0)
Monocytes Absolute: 0.9 K/uL (ref 0.1–1.0)
Monocytes Relative: 9 %
Neutro Abs: 6.9 K/uL (ref 1.7–7.7)
Neutrophils Relative %: 71 %
Platelets: 234 K/uL (ref 150–400)
RBC: 4.78 MIL/uL (ref 3.87–5.11)
RDW: 12.7 % (ref 11.5–15.5)
WBC: 9.8 K/uL (ref 4.0–10.5)
nRBC: 0 % (ref 0.0–0.2)

## 2020-12-03 LAB — COMPREHENSIVE METABOLIC PANEL WITH GFR
ALT: 15 U/L (ref 0–44)
AST: 22 U/L (ref 15–41)
Albumin: 4 g/dL (ref 3.5–5.0)
Alkaline Phosphatase: 89 U/L (ref 38–126)
Anion gap: 10 (ref 5–15)
BUN: 14 mg/dL (ref 6–20)
CO2: 23 mmol/L (ref 22–32)
Calcium: 9.7 mg/dL (ref 8.9–10.3)
Chloride: 102 mmol/L (ref 98–111)
Creatinine, Ser: 1.15 mg/dL — ABNORMAL HIGH (ref 0.44–1.00)
GFR, Estimated: 55 mL/min — ABNORMAL LOW
Glucose, Bld: 120 mg/dL — ABNORMAL HIGH (ref 70–99)
Potassium: 3.3 mmol/L — ABNORMAL LOW (ref 3.5–5.1)
Sodium: 135 mmol/L (ref 135–145)
Total Bilirubin: 1.4 mg/dL — ABNORMAL HIGH (ref 0.3–1.2)
Total Protein: 6.5 g/dL (ref 6.5–8.1)

## 2020-12-03 LAB — RESP PANEL BY RT-PCR (FLU A&B, COVID) ARPGX2
Influenza A by PCR: NEGATIVE
Influenza B by PCR: NEGATIVE
SARS Coronavirus 2 by RT PCR: NEGATIVE

## 2020-12-03 LAB — D-DIMER, QUANTITATIVE: D-Dimer, Quant: 0.4 ug/mL-FEU (ref 0.00–0.50)

## 2020-12-03 LAB — TROPONIN I (HIGH SENSITIVITY)
Troponin I (High Sensitivity): 12 ng/L (ref <18)
Troponin I (High Sensitivity): 11 ng/L (ref <18)

## 2020-12-03 MED ORDER — SODIUM CHLORIDE 0.9 % IV BOLUS
1000.0000 mL | Freq: Once | INTRAVENOUS | Status: AC
  Administered 2020-12-03: 1000 mL via INTRAVENOUS

## 2020-12-03 NOTE — Discharge Planning (Signed)
Siddhanth Denk J. Lucretia Roers, RN, BSN, [place] [phone]  RNCM set up appointment with Gwinda Passe, NP on 8/30 @9 :10.  Spoke with pt at bedside and advised to please arrive 15 min early and take a picture ID and your current medications.  Pt verbalizes understanding of keeping appointment.

## 2020-12-03 NOTE — ED Triage Notes (Signed)
Patient arrived with EMS from home reports near syncopal episode this evening with lightheaded/dizziness , mild nausea , denies headache , alert and oriented /respirations unlabored.

## 2020-12-03 NOTE — Discharge Instructions (Addendum)
Urine culture has been sent.  You will be notified if it is positive.  Follow-up with your primary care doctors and they should be able to help you with the pain.  Also potentially follow-up with GYN.

## 2020-12-03 NOTE — ED Provider Notes (Signed)
[facility] EMERGENCY DEPARTMENT Provider Note   CSN: [id number] Arrival date & time: 12/03/20  6222     History Chief Complaint  Patient presents with   Near Syncope / Lightheaded    [name] is a 60 y.o. female.  HPI Patient presents with near syncopal episode.  States she was sitting at home reading in bed.  States she began to feel acutely shortness of breath.  Also felt like she had passed out.  States she got up to walk around but then began to feel even worse.  No fevers or chills.  No cough.  No chest pain.  Did not feel her heart going fast slow.  No swelling in her legs.  Has been losing weight intentionally.  States she has been eating more healthy.  States there is a chance she is dehydrated.  States she has been feeling a little bad overall.  More fatigued.    Past Medical History:  Diagnosis Date   Anxiety    Autism    Depression    GERD (gastroesophageal reflux disease)    Heart murmur    Hyperlipidemia    Hypertension    Kidney stones    Renal disorder     Patient Active Problem List   Diagnosis Date Noted   History of thrombocytopenia 04/10/2019   Esophageal dysphagia 04/10/2019   Gastroesophageal reflux disease 04/10/2019   Acute non-recurrent frontal sinusitis 07/05/2018   Allergic rhinitis 08/24/2015   Autism 08/24/2015   Sleep difficulties 08/24/2015   Depression 08/24/2015   Anxiety 08/24/2015   Elevated blood pressure 08/24/2015   Adhesive capsulitis of right shoulder 06/13/2013    Past Surgical History:  Procedure Laterality Date   NO PAST SURGERIES       OB History   No obstetric history on file.     Family History  Problem Relation Age of Onset   Heart disease Father    Hypertension Father    Other Father        idiopathic pulmonary fibrosis   Liver cancer Paternal Grandmother     Social History   Tobacco Use   Smoking status: Never   Smokeless tobacco: Never  Vaping Use   Vaping Use: Never  used  Substance Use Topics   Alcohol use: No   Drug use: No    Home Medications Prior to Admission medications   Medication Sig Start Date End Date Taking? Authorizing Provider  cetirizine (ZYRTEC) 10 MG tablet Take 1 tablet (10 mg total) by mouth daily. Patient not taking: Reported on 12/03/2020 08/24/15   Binnie Rail, MD  diclofenac Sodium (VOLTAREN) 1 % GEL Apply 2 g topically 4 (four) times daily. Patient not taking: No sig reported 04/10/19   Henson, Vickie L, NP-C  HYDROcodone-acetaminophen (NORCO/VICODIN) 5-325 MG tablet Take 1 tablet by mouth every 6 (six) hours as needed. Patient not taking: No sig reported 08/23/20   Valarie Merino, MD  ibuprofen (ADVIL) 600 MG tablet Take 1 tablet (600 mg total) by mouth every 8 (eight) hours as needed. Patient not taking: No sig reported 08/23/20   Valarie Merino, MD  ketorolac (TORADOL) 10 MG tablet Take 1 tablet (10 mg total) by mouth every 6 (six) hours as needed. Patient not taking: No sig reported 08/26/20   Ermelinda Das, MD  meloxicam (MOBIC) 7.5 MG tablet Take 1 tablet (7.5 mg total) by mouth daily. Patient not taking: No sig reported 04/10/19   Girtha Rm, NP-C  Na Sulfate-K Sulfate-Mg Sulf (SUPREP BOWEL PREP KIT) 17.5-3.13-1.6 GM/177ML SOLN Take 1 kit by mouth as directed. Patient not taking: No sig reported 05/15/19   Thornton Park, MD  ondansetron (ZOFRAN ODT) 4 MG disintegrating tablet Take 1 tablet (4 mg total) by mouth every 8 (eight) hours as needed for nausea or vomiting. Patient not taking: No sig reported 08/23/20   Valarie Merino, MD  ondansetron (ZOFRAN) 4 MG tablet Take 1 tablet (4 mg total) by mouth every 8 (eight) hours as needed for nausea or vomiting. Patient not taking: No sig reported 08/26/20 08/26/21  Ermelinda Das, MD  pantoprazole (PROTONIX) 40 MG tablet Take 1 tablet (40 mg total) by mouth every morning. Take 3- 60 mins before breakfast Patient not taking: No sig reported 05/15/19   Thornton Park,  MD    Allergies    Amoxicillin, Elemental sulfur, and Celexa [citalopram hydrobromide]  Review of Systems   Review of Systems  Constitutional:  Negative for appetite change.  HENT:  Negative for congestion.   Respiratory:  Positive for shortness of breath.   Cardiovascular:  Negative for chest pain.  Gastrointestinal:  Negative for abdominal pain.  Genitourinary:  Negative for flank pain.  Musculoskeletal:  Negative for back pain.  Neurological:  Positive for light-headedness. Negative for numbness.  Psychiatric/Behavioral:  Negative for confusion.    Physical Exam Updated Vital Signs BP (!) 159/87 (BP Location: Right Arm)   Pulse 66   Temp 98.3 F (36.8 C) (Oral)   Resp 18   SpO2 98%   Physical Exam Vitals and nursing note reviewed.  HENT:     Head: Atraumatic.  Eyes:     Pupils: Pupils are equal, round, and reactive to light.  Cardiovascular:     Rate and Rhythm: Regular rhythm.  Pulmonary:     Breath sounds: No wheezing or rhonchi.  Abdominal:     Tenderness: There is no abdominal tenderness.  Musculoskeletal:        General: No tenderness.     Cervical back: Neck supple.  Skin:    General: Skin is warm.     Capillary Refill: Capillary refill takes less than 2 seconds.  Neurological:     Mental Status: She is alert and oriented to person, place, and time.    ED Results / Procedures / Treatments   Labs (all labs ordered are listed, but only abnormal results are displayed) Labs Reviewed  COMPREHENSIVE METABOLIC PANEL - Abnormal; Notable for the following components:      Result Value   Potassium 3.3 (*)    Glucose, Bld 120 (*)    Creatinine, Ser 1.15 (*)    Total Bilirubin 1.4 (*)    GFR, Estimated 55 (*)    All other components within normal limits  RESP PANEL BY RT-PCR (FLU A&B, COVID) [id number]  URINE CULTURE  CBC WITH DIFFERENTIAL/PLATELET  D-DIMER, QUANTITATIVE  TROPONIN I (HIGH SENSITIVITY)  TROPONIN I (HIGH SENSITIVITY)     EKG None  Radiology DG Chest Portable 1 View  Result Date: 12/03/2020 CLINICAL DATA:  Shortness of breath.  Near syncope. EXAM: PORTABLE CHEST 1 VIEW COMPARISON:  12/13/2017 FINDINGS: 0845 hours. The lungs are clear without focal pneumonia, edema, pneumothorax or pleural effusion. The cardiopericardial silhouette is within normal limits for size. Small hiatal hernia as seen on recent CT stone study. The visualized bony structures of the thorax show no acute abnormality. IMPRESSION: No active disease. Small hiatal hernia. Electronically Signed   By: Verda Cumins.D.  On: 12/03/2020 09:14    Procedures Procedures   Medications Ordered in ED Medications  sodium chloride 0.9 % bolus 1,000 mL (0 mLs Intravenous Stopped 12/03/20 1030)    ED Course  I have reviewed the triage vital signs and the nursing notes.  Pertinent labs & imaging results that were available during my care of the patient were reviewed by me and considered in my medical decision making (see chart for details).    MDM Rules/Calculators/A&P                           Patient presents with near syncope episode.  Began last night while she was sitting watching TV.  Feeling somewhat better now.  Is felt a little bad however.  Had some shortness of breath last night.  No swelling in her legs.  No fevers.  No coughing.  EKG reassuring.  D-dimer negative.  Denies dysuria but states she does have some pain in the vaginal area.  States there may be a foul smell.  Denies sexual intercourse.  Denies real discharge.  Exam done and showed no real clear abnormality but was tender anteriorly.  No skin changes.  No discharge.  Patient appears stable to follow-up with GYN for that.  Denies dysuria but with pain in this area urine culture was sent.  If positive I probably would treat however.  Will discharge home. Final Clinical Impression(s) / ED Diagnoses Final diagnoses:  Near syncope  Vulvodynia    Rx / DC Orders ED Discharge  Orders     None        Davonna Belling, MD 12/03/20 1530

## 2020-12-03 NOTE — ED Notes (Signed)
Pt verbalized understanding of d/c instructions, meds and followup care. Pt reported difficulty obtaining PCP and insurance coverage; RN discussed with SW/CM and got appt with clinic for PCP placement and insurance assistance. Pt verbalized understanding. Denies questions. VSS, no distress noted. Steady gait to exit.

## 2020-12-04 LAB — URINE CULTURE: Culture: <10000 — AB

## 2020-12-28 ENCOUNTER — Other Ambulatory Visit: Payer: Self-pay

## 2020-12-28 ENCOUNTER — Telehealth: Payer: Self-pay

## 2020-12-28 ENCOUNTER — Encounter (INDEPENDENT_AMBULATORY_CARE_PROVIDER_SITE_OTHER): Payer: Self-pay | Admitting: Primary Care

## 2020-12-28 ENCOUNTER — Ambulatory Visit (INDEPENDENT_AMBULATORY_CARE_PROVIDER_SITE_OTHER): Payer: Self-pay | Admitting: Primary Care

## 2020-12-28 VITALS — BP 130/85 | HR 59 | Temp 97.2°F | Ht 64.0 in | Wt 155.0 lb

## 2020-12-28 DIAGNOSIS — F419 Anxiety disorder, unspecified: Secondary | ICD-10-CM

## 2020-12-28 DIAGNOSIS — F4011 Social phobia, generalized: Secondary | ICD-10-CM

## 2020-12-28 DIAGNOSIS — F840 Autistic disorder: Secondary | ICD-10-CM

## 2020-12-28 DIAGNOSIS — Z6826 Body mass index (BMI) 26.0-26.9, adult: Secondary | ICD-10-CM

## 2020-12-28 DIAGNOSIS — E876 Hypokalemia: Secondary | ICD-10-CM

## 2020-12-28 DIAGNOSIS — F32A Depression, unspecified: Secondary | ICD-10-CM

## 2020-12-28 DIAGNOSIS — I10 Essential (primary) hypertension: Secondary | ICD-10-CM

## 2020-12-28 DIAGNOSIS — Z7689 Persons encountering health services in other specified circumstances: Secondary | ICD-10-CM

## 2020-12-28 DIAGNOSIS — F5104 Psychophysiologic insomnia: Secondary | ICD-10-CM

## 2020-12-28 MED ORDER — MELATONIN 5 MG PO CHEW
CHEWABLE_TABLET | ORAL | 0 refills | Status: AC
Start: 2020-12-28 — End: ?
  Filled 2020-12-28: qty 90, fill #0

## 2020-12-28 MED ORDER — BUPROPION HCL ER (SR) 150 MG PO TB12
150.0000 mg | ORAL_TABLET | Freq: Two times a day (BID) | ORAL | 0 refills | Status: AC
  Filled 2020-12-28: qty 60, 30d supply, fill #0

## 2020-12-28 MED ORDER — POTASSIUM CHLORIDE ER 10 MEQ PO TBCR
20.0000 meq | EXTENDED_RELEASE_TABLET | Freq: Every day | ORAL | 0 refills | Status: AC
  Filled 2020-12-28: qty 60, 30d supply, fill #0
  Filled 2021-02-04: qty 30, 15d supply, fill #1
  Filled 2021-02-04: qty 60, 30d supply, fill #1

## 2020-12-28 MED ORDER — HYDROCHLOROTHIAZIDE 25 MG PO TABS
25.0000 mg | ORAL_TABLET | Freq: Every day | ORAL | 0 refills | Status: DC
Start: 2020-12-28 — End: 2020-12-28

## 2020-12-28 NOTE — Patient Instructions (Addendum)
Insomnia Insomnia is a sleep disorder that makes it difficult to fall asleep or stay asleep. Insomnia can cause fatigue, low energy, difficulty concentrating, moodswings, and poor performance at work or school. There are three different ways to classify insomnia: Difficulty falling asleep. Difficulty staying asleep. Waking up too early in the morning. Any type of insomnia can be long-term (chronic) or short-term (acute). Both are common. Short-term insomnia usually lasts for three months or less. Chronic insomnia occurs at least three times a week for longer than threemonths. What are the causes? Insomnia may be caused by another condition, situation, or substance, such as: Anxiety. Certain medicines. Gastroesophageal reflux disease (GERD) or other gastrointestinal conditions. Asthma or other breathing conditions. Restless legs syndrome, sleep apnea, or other sleep disorders. Chronic pain. Menopause. Stroke. Abuse of alcohol, tobacco, or illegal drugs. Mental health conditions, such as depression. Caffeine. Neurological disorders, such as Alzheimer's disease. An overactive thyroid (hyperthyroidism). Sometimes, the cause of insomnia may not be known. What increases the risk? Risk factors for insomnia include: Gender. Women are affected more often than men. Age. Insomnia is more common as you get older. Stress. Lack of exercise. Irregular work schedule or working night shifts. Traveling between different time zones. Certain medical and mental health conditions. What are the signs or symptoms? If you have insomnia, the main symptom is having trouble falling asleep or having trouble staying asleep. This may lead to other symptoms, such as: Feeling fatigued or having low energy. Feeling nervous about going to sleep. Not feeling rested in the morning. Having trouble concentrating. Feeling irritable, anxious, or depressed. How is this diagnosed? This condition may be diagnosed based  on: Your symptoms and medical history. Your health care provider may ask about: Your sleep habits. Any medical conditions you have. Your mental health. A physical exam. How is this treated? Treatment for insomnia depends on the cause. Treatment may focus on treating an underlying condition that is causing insomnia. Treatment may also include: Medicines to help you sleep. Counseling or therapy. Lifestyle adjustments to help you sleep better. Follow these instructions at home: Eating and drinking  Limit or avoid alcohol, caffeinated beverages, and cigarettes, especially close to bedtime. These can disrupt your sleep. Do not eat a large meal or eat spicy foods right before bedtime. This can lead to digestive discomfort that can make it hard for you to sleep.  Sleep habits  Keep a sleep diary to help you and your health care provider figure out what could be causing your insomnia. Write down: When you sleep. When you wake up during the night. How well you sleep. How rested you feel the next day. Any side effects of medicines you are taking. What you eat and drink. Make your bedroom a dark, comfortable place where it is easy to fall asleep. Put up shades or blackout curtains to block light from outside. Use a white noise machine to block noise. Keep the temperature cool. Limit screen use before bedtime. This includes: Watching TV. Using your smartphone, tablet, or computer. Stick to a routine that includes going to bed and waking up at the same times every day and night. This can help you fall asleep faster. Consider making a quiet activity, such as reading, part of your nighttime routine. Try to avoid taking naps during the day so that you sleep better at night. Get out of bed if you are still awake after 15 minutes of trying to sleep. Keep the lights down, but try reading or doing a quiet   activity. When you feel sleepy, go back to bed.  General instructions Take over-the-counter  and prescription medicines only as told by your health care provider. Exercise regularly, as told by your health care provider. Avoid exercise starting several hours before bedtime. Use relaxation techniques to manage stress. Ask your health care provider to suggest some techniques that may work well for you. These may include: Breathing exercises. Routines to release muscle tension. Visualizing peaceful scenes. Make sure that you drive carefully. Avoid driving if you feel very sleepy. Keep all follow-up visits as told by your health care provider. This is important. Contact a health care provider if: You are tired throughout the day. You have trouble in your daily routine due to sleepiness. You continue to have sleep problems, or your sleep problems get worse. Get help right away if: You have serious thoughts about hurting yourself or someone else. If you ever feel like you may hurt yourself or others, or have thoughts about taking your own life, get help right away. You can go to your nearest emergency department or call: Your local emergency services (911 in the [place] A suicide crisis helpline, such as the National Suicide Prevention Lifeline at 6066725069. This is open 24 hours a day. Summary Insomnia is a sleep disorder that makes it difficult to fall asleep or stay asleep. Insomnia can be long-term (chronic) or short-term (acute). Treatment for insomnia depends on the cause. Treatment may focus on treating an underlying condition that is causing insomnia. Keep a sleep diary to help you and your health care provider figure out what could be causing your insomnia. This information is not intended to replace advice given to you by your health care provider. Make sure you discuss any questions you have with your healthcare provider. Document Revised: 02/26/2020 Document Reviewed: 02/26/2020 Elsevier Patient Education  2022 [employer]vier Inc. Potassium Content of Foods  The body needs  potassium to control blood pressure and to keep the muscles and nervous system healthy. Here are some healthy foods below that are high in potassium. Also you can get the white label salt of "NO SALT" salt substitute, 1/4 teaspoon of this is equivalent to potassium.   FOODS AND DRINKS HIGH IN POTASSIUM FOODS MODERATE IN POTASSIUM   Fruits Avocado (cubed),  c / 50 g. Banana (sliced), 75 g. Cantaloupe (cubed), 80 g. Honeydew, 1 wedge / 85 g. Kiwi (sliced), 90 g. Nectarine, 1 small / 129 g. Orange, 1 medium / 131 g. Vegetables Artichoke,  of a medium / 64 g. Asparagus (boiled), 90 g.. Broccoli (boiled), 78 g. Brussels sprout (boiled), 78 g. Butternut squash (baked), 103 g. Chickpea (cooked), 82 g. Green peas (cooked), 80 g. Kidney beans (cooked), 5 tbsp / 55 g. Lima beans (cooked),  c / 43 g. Navy beans (cooked),  c / 61 g. Spinach (cooked),  c / 45 g. Sweet potato (baked),  c / 50 g. Tomato (chopped or sliced), 90 g. Vegetable juice. White mushrooms (cooked), 78 g. Yam (cooked or baked),  c / 34 g. Zucchini squash (boiled), 90 g. Other Foods and Drinks Almonds (whole),  c / 36 g. Fish, 3 oz / 85 g. Nonfat fruit variety yogurt, 123 g. Pistachio nuts, 1 oz / 28 g. Pumpkin seeds, 1 oz / 28 g. Red meat (broiled, cooked, grilled), 3 oz / 85 g. Scallops (steamed), 3 oz / 85 g. Spaghetti sauce,  c / 66 g. Sunflower seeds (dry roasted), 1 oz / 28 g. Veggie burger, 1  patty / 70 g. Fruits Grapefruit,  of the fruit / 123 g Plums (sliced), 83 g. Tangerine, 1 large / 120 g. Vegetables Carrots (boiled), 78 g. Carrots (sliced), 61 g. Rhubarb (cooked with sugar), 120 g. Rutabaga (cooked), 120 g. Yellow snap beans (cooked), 63 g. Other Foods and Drinks  Chicken breast (roasted and chopped),  c / 70 g. Pita bread, 1 large / 64 g. Shrimp (steamed), 4 oz / 113 g. Swiss cheese (diced), 70 g.

## 2020-12-28 NOTE — Telephone Encounter (Signed)
Warm Handoff during today's office visit with PCP. Patient shared his recent struggles with anxiety to do current living arrangement stressors. PCP engaged Case Manager to provide insight on commnuity housing and employment resources. Case Manager shared resources with patient for patient behavioral health with our LCSW and information on how to contact the clinic as well.    PCP sent medications to [facility], set up patients Establish Care in six weeks and faxed patients Mammogram Application with front desk, as well as connect her with LCSW.

## 2020-12-28 NOTE — Progress Notes (Signed)
[facility]   Subjective:   [name] is a 60 y.o. female presents wearing plastic gloves for Emergency room follow up dx was  near syncopal episode. Patient states she was sitting at home reading a book in bed. She found she was gasping for air unable to take a deep breath or breath. She called 911 and her Bp was extremely elevated and taken to the ER.   No fevers or chills.  No cough.  No chest pain.  She has lost 15 pounds since April losing weight intentionally by changing her diet and exercising.  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 She lives with her mother currently unemployed and the environment is toxic. Her mother is narcicisstic and controlling. She does not allow her to put food in the refrigerator - takes up too much room -so she was eating process  meals or can foods. She does have brothers but one has alienated her and his family. The other brother can do no wrong- she has promised to leave him everything.  Past Medical History:  Diagnosis Date   Anxiety    Autism    Depression    GERD (gastroesophageal reflux disease)    Heart murmur    Hyperlipidemia    Hypertension    Kidney stones    Renal disorder      Allergies  Allergen Reactions   Amoxicillin Other (See Comments)    Bloody diarrhea  Has patient had a PCN reaction causing immediate rash, facial/tongue/throat swelling, SOB or lightheadedness with hypotension: No Has patient had a PCN reaction causing severe rash involving mucus membranes or skin necrosis: No Has patient had a PCN reaction that required hospitalization: No Has patient had a PCN reaction occurring within the last 10 years: No If all of the above answers are "NO", then may proceed with Cephalosporin use.    Elemental Sulfur Other (See Comments)    Nausea, vomiting, abdominal cramping   Celexa [Citalopram Hydrobromide] Rash      No current outpatient medications on file prior to visit.   No current  facility-administered medications on file prior to visit.     Review of System: Review of Systems  Constitutional:  Positive for weight loss.       Intentional   Psychiatric/Behavioral:  Positive for depression. The patient is nervous/anxious and has insomnia.   All other systems reviewed and are negative.  Objective:  BP (!) 154/93 (BP Location: Right Arm, Patient Position: Sitting, Cuff Size: Normal)   Pulse (!) 56   Temp (!) 97.2 F (36.2 C) (Temporal)   Ht 5\' 4"  (1.626 m)   Wt 155 lb (70.3 kg)   SpO2 97%   BMI 26.61 kg/m   Filed Weights   12/28/20 0914  Weight: 155 lb (70.3 kg)   Physical Exam: General Appearance: Well nourished, in no apparent distress. Eyes: PERRLA, EOMs, conjunctiva no swelling or erythema Sinuses: No Frontal/maxillary tenderness ENT/Mouth: Ext aud canals clear, TMs without erythema, bulging.  Hearing normal.  Neck: Supple, thyroid normal.  Respiratory: Respiratory effort normal, BS equal bilaterally without rales, rhonchi, wheezing or stridor.  Cardio: RRR with no MRGs. Brisk peripheral pulses without edema.  Abdomen: Soft, + BS.  Non tender, no guarding, rebound, hernias, masses. Lymphatics: Non tender without lymphadenopathy.  Musculoskeletal: Full ROM, 5/5 strength, normal gait.  Skin: Warm, dry without rashes, lesions, ecchymosis.  Neuro: Normal muscle tone, no cerebellar symptoms. Sensation intact.  Psych: Awake and oriented X 3, normal affect, Insight  and Judgment appropriate.   Assessment:  [name] was seen today for hospitalization follow-up.  Diagnoses and all orders for this visit:  Encounter to establish care Establish care   Essential hypertension Counseled on blood pressure goal of less than 130/80, low-sodium, DASH diet, medication compliance, 150 minutes of moderate intensity exercise per week. Discussed medication compliance, adverse effects.  -     hydrochlorothiazide (HYDRODIURIL) 12.5 MG tablet; Take 1 tablet (12.5 mg  total) by mouth daily.  Autism Diagnosed with mild autism in her 30s  Anxiety and depression Flowsheet Row Office Visit from 04/10/2019 in [facility]  PHQ-9 Total Score 10      Started on Wellbutrin 150mg  bid  BMI 26.0-26.9,adult Discussed diet and exercise for person with BMI >25. Instructed: You must burn more calories than you eat. Losing 5 percent of your body weight should be considered a success. In the longer term, losing more than 15 percent of your body weight and staying at this weight is an extremely good result. However, keep in mind that even losing 5 percent of your body weight leads to important health benefits, so try not to get discouraged if you're not able to lose more than this. Will recheck weight in 3-6 months.   Generalized social phobia and germs  Refer to CSW/transitional coordinator seen today refer psychiatry   Psychophysiological insomnia   Insomnia Onset: Pattern:    Difficulty going to sleep:yes     Frequent awakening:yes     Early awakening:yes Nightmares: no Abnormal leg movement: yes  Snoring:unknown  Apnea:no  Risk factors/sleep hygiene:    Stimulants:    Alcohol intake:no    Reading, watching TV, eating @ bedtime:    Daytime naps: no    Stress/anxiety: yes    Work/travel factors: unemployed  Impact:    Daytime hypersomnolence: no     Motor vehicle accident/motor dysfunction: no  Evaluation to date: 12/28/20 Treatment to date/efficacy:      This note has been created with 12/30/20. Any transcriptional errors are unintentional.   Education officer, environmental, NP 12/28/2020, 9:30 AM

## 2020-12-29 ENCOUNTER — Telehealth (INDEPENDENT_AMBULATORY_CARE_PROVIDER_SITE_OTHER): Payer: Self-pay

## 2020-12-29 NOTE — Telephone Encounter (Signed)
Copied from CRM [id number]. Topic: General - Other >> Dec 28, 2020  2:46 PM Traci Sermon wrote: Reason for CRM: Pt called in stating when she saw her PCP today, she stated pcp would put her on 4 medications but she is only received 3. Pt requested a call back to double check that that Is right, Please advise

## 2020-12-31 NOTE — Telephone Encounter (Signed)
First attempt to reach pt, no answer, left vm. Will try again next week.

## 2021-01-03 ENCOUNTER — Other Ambulatory Visit (INDEPENDENT_AMBULATORY_CARE_PROVIDER_SITE_OTHER): Payer: Self-pay | Admitting: Primary Care

## 2021-01-04 NOTE — Telephone Encounter (Signed)
Left message asking patient to return call to [phone].  

## 2021-01-05 NOTE — Progress Notes (Signed)
Letter sent for reminder with employment resources to follow up with LCSW for an [facility].

## 2021-01-06 NOTE — Telephone Encounter (Signed)
Left message asking patient to return call to [phone].  

## 2021-01-07 NOTE — Telephone Encounter (Signed)
Second attempt to reach this pt, no answer,  left vm.

## 2021-01-10 NOTE — Telephone Encounter (Signed)
Patient was also mailed information on local staffing agencies. I did share in the mailed letter to her to please reach out to you to schedule an appointment.

## 2021-01-14 NOTE — Telephone Encounter (Signed)
Third and final attempt to reach pt to schedule an appt, no answer, left vm. Message sent in [place].

## 2021-02-04 ENCOUNTER — Ambulatory Visit (INDEPENDENT_AMBULATORY_CARE_PROVIDER_SITE_OTHER): Payer: Self-pay | Admitting: *Deleted

## 2021-02-04 ENCOUNTER — Other Ambulatory Visit: Payer: Self-pay

## 2021-02-04 ENCOUNTER — Other Ambulatory Visit (INDEPENDENT_AMBULATORY_CARE_PROVIDER_SITE_OTHER): Payer: Self-pay | Admitting: Primary Care

## 2021-02-04 DIAGNOSIS — E876 Hypokalemia: Secondary | ICD-10-CM

## 2021-02-04 NOTE — Telephone Encounter (Signed)
Call patient added CMP for future before K+ refilled. Left a message

## 2021-02-04 NOTE — Telephone Encounter (Signed)
Patient called to report to PCP B/P medication "potassium" potassium chloride (Klor-Con) 10 mEq  taking 20 mEq daily is not working to decrease B/P . Patient was in her car waiting for refill of potassium and unable to check B/P. Instructed patient to check B/P at rest when she gets home and call back if B/P greater than 180/100. Patient reports she is dizzy, headaches, blurred vision . Instructed patient to go to ED for evaluation and patient reports every time she goes to ED she is not given medications and is told to go home and call PCP. Patient reports she has stopped taking Wellbutrin due to side effects of feeling "jittery" anxious and felt better prior to started medication. Patient reports stopping Wellbutrin 3 days ago  due to too many side effects. Please advise if additional B/P medications to prescribe. Appt scheduled Wednesday 02/09/21 per patient. Care advise given. Patient verbalized understanding of care advise and to call back or go to ED if symptoms worsen or call 911.

## 2021-02-04 NOTE — Telephone Encounter (Signed)
Reason for Disposition  [1] Systolic BP  >= 130 OR Diastolic >= 80 AND [2] not taking BP medications  Answer Assessment - Initial Assessment Questions 1. BLOOD PRESSURE: "What is the blood pressure?" "Did you take at least two measurements 5 minutes apart?"     Patient unable to take B/P she is in her car waiting at pharmacy to refill meds 2. ONSET: "When did you take your blood pressure?"     This am , patient reports B/P 133 / 101 3. HOW: "How did you obtain the blood pressure?" (e.g., visiting nurse, automatic home BP monitor)     Automatic BP monitor 4. HISTORY: "Do you have a history of high blood pressure?"     Yes 5. MEDICATIONS: "Are you taking any medications for blood pressure?" "Have you missed any doses recently?"     Yes,  no missed doses 6. OTHER SYMPTOMS: "Do you have any symptoms?" (e.g., headache, chest pain, blurred vision, difficulty breathing, weakness)    Dizziness, blurred vision , headaches. On going per patient and PCP aware  7. PREGNANCY: "Is there any chance you are pregnant?" "When was your last menstrual period?"     na  Protocols used: Blood Pressure - High-A-AH

## 2021-02-07 NOTE — Telephone Encounter (Signed)
FYI

## 2021-02-08 NOTE — Telephone Encounter (Signed)
Patient aware.

## 2021-02-09 ENCOUNTER — Ambulatory Visit (INDEPENDENT_AMBULATORY_CARE_PROVIDER_SITE_OTHER): Payer: Self-pay | Admitting: Primary Care

## 2021-02-10 ENCOUNTER — Ambulatory Visit (INDEPENDENT_AMBULATORY_CARE_PROVIDER_SITE_OTHER): Payer: Self-pay | Admitting: Primary Care

## 2022-03-15 ENCOUNTER — Encounter: Payer: Self-pay | Admitting: Internal Medicine

## 2022-06-26 IMAGING — CT CT RENAL STONE PROTOCOL
2 of 4 series · 15 of 46 positions shown, 17 images · non-contrast
Comparison: August 23, 2012

CLINICAL DATA: Left flank pain

EXAM:
CT ABDOMEN AND PELVIS WITHOUT CONTRAST
TECHNIQUE: Multidetector CT imaging of the abdomen and pelvis was performed
following the standard protocol without oral or IV contrast.

[Series 3: ap without · axial · non-contrast · 0.77mm/px · z∈[-564,-179]mm · 12 of 87 slices shown, 14 images]
[im 5/87  soft-tissue]
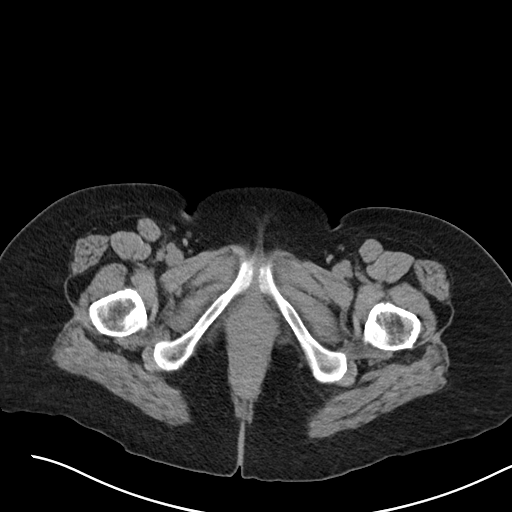
[im 5/87  bone]
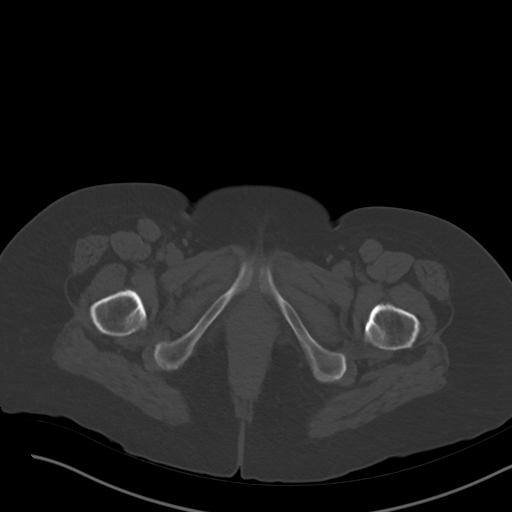
[im 15/87  soft-tissue]
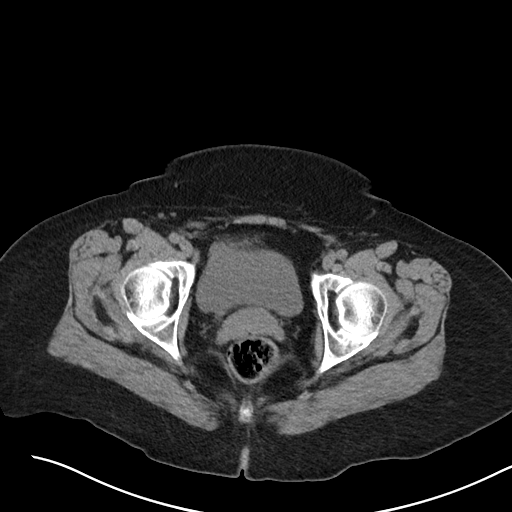
[im 20/87  soft-tissue]
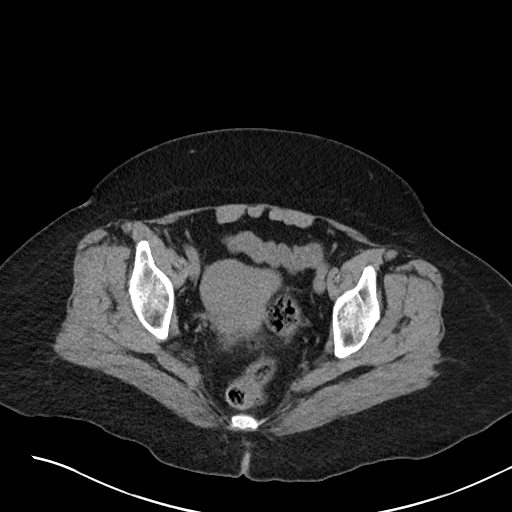
[im 24/87  soft-tissue]
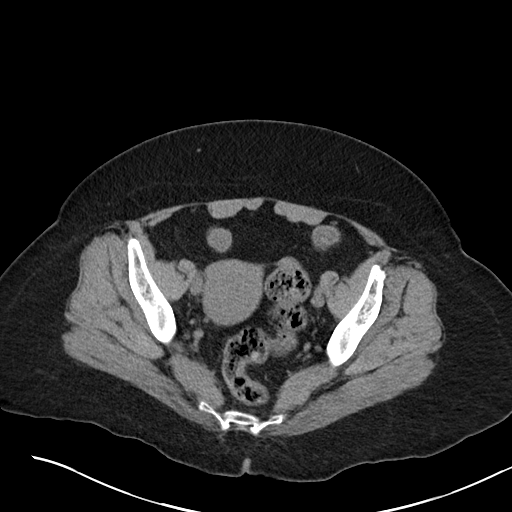
[im 34/87  soft-tissue]
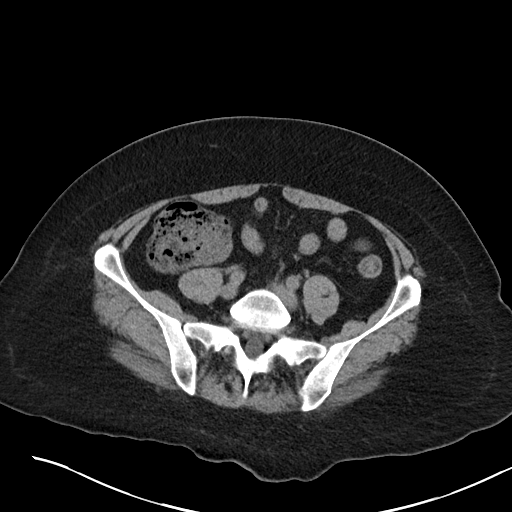
[im 39/87  soft-tissue]
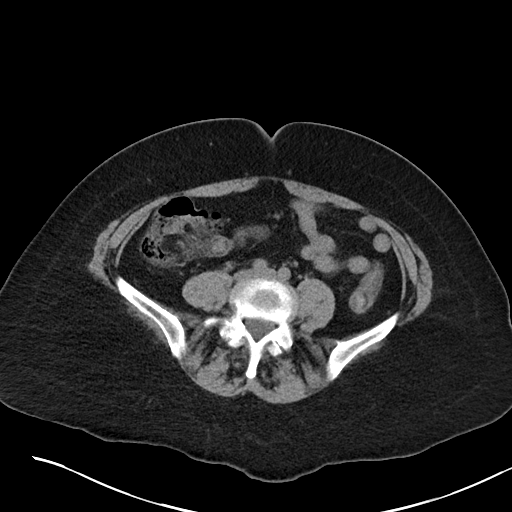
[im 48/87  soft-tissue]
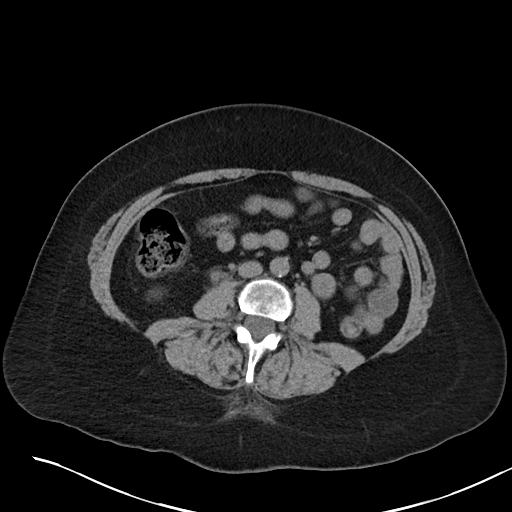
[im 53/87  soft-tissue]
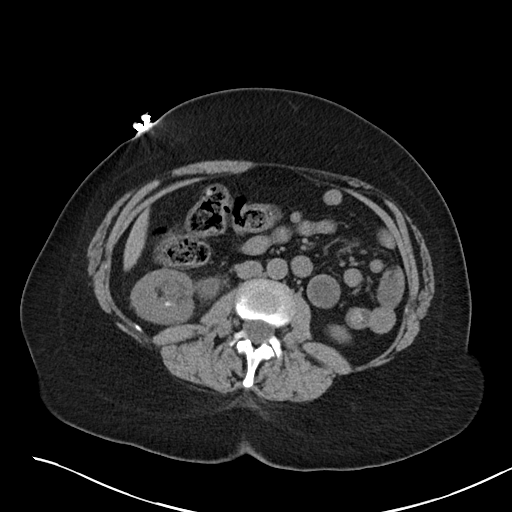
[im 63/87  soft-tissue]
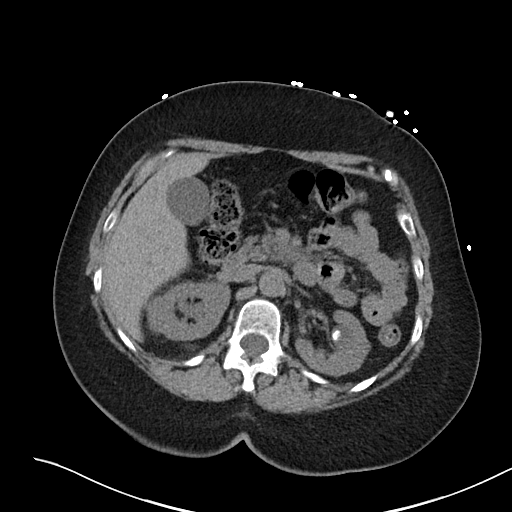
[im 63/87  bone]
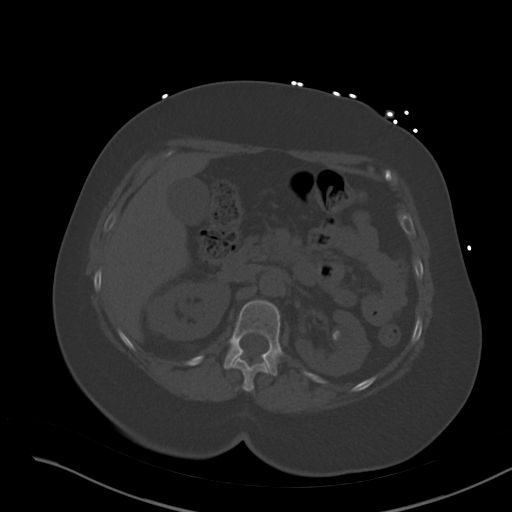
[im 67/87  soft-tissue]
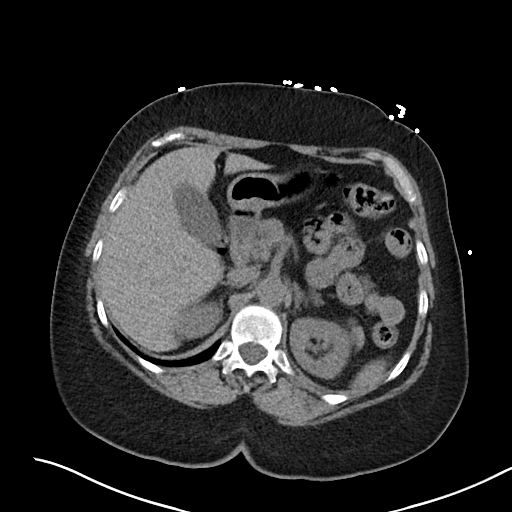
[im 72/87  soft-tissue]
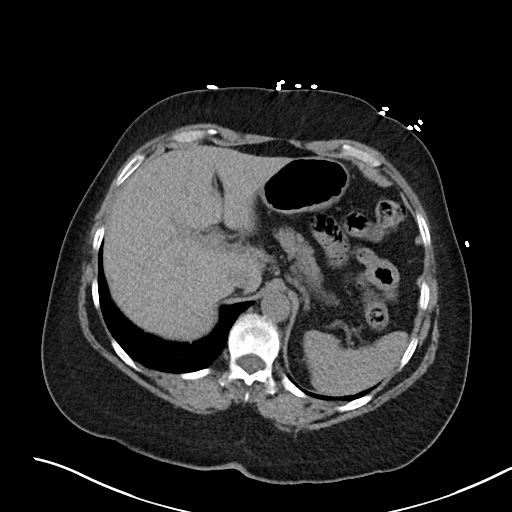
[im 82/87  soft-tissue]
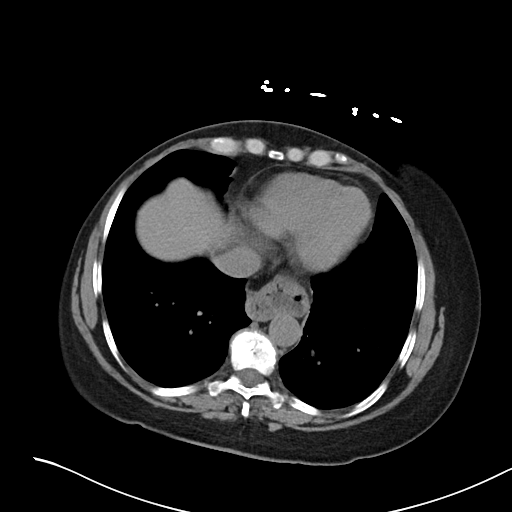

[Series 6: cor · coronal · 0.72mm/px · 3 of 103 slices shown]
[im 35/103  soft-tissue]
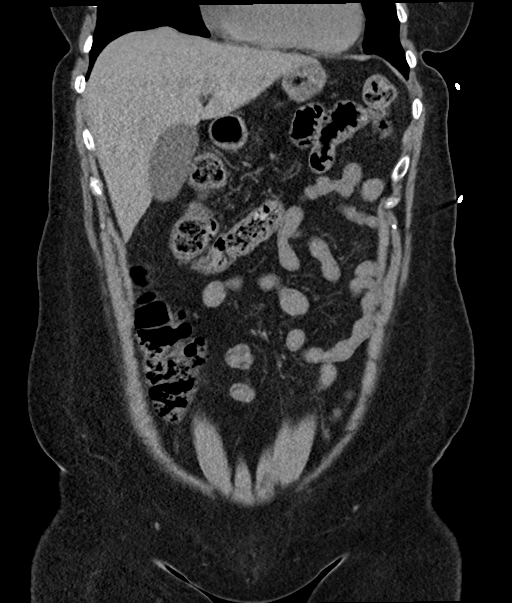
[im 46/103  soft-tissue]
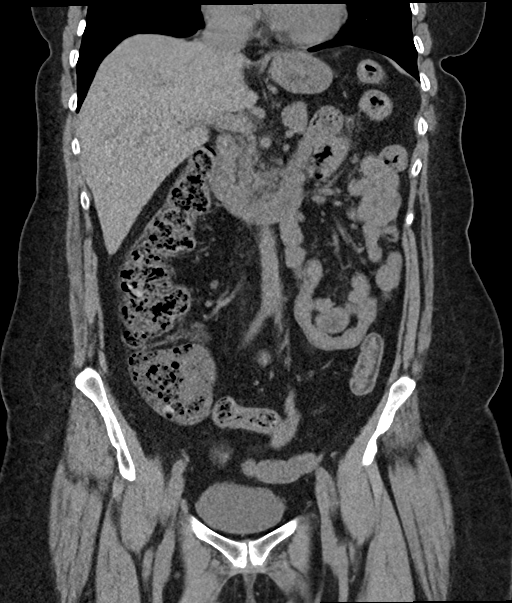
[im 57/103  soft-tissue]
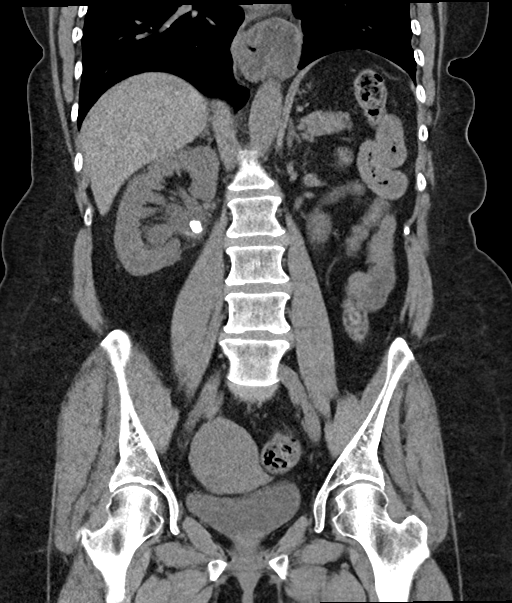

[15 of 46 positions shown; findings below may reference images not displayed]

FINDINGS: Lower chest: There is slight bibasilar scarring. No lung base edema
or consolidation. There is a focal hiatal hernia.

Hepatobiliary: There is a cyst near the dome of the liver on the
right anteriorly measuring 1.4 x 1.2 cm. No other focal liver
lesions are evident on this noncontrast enhanced study. Gallbladder
wall is not appreciably thickened. There is no biliary duct
dilatation.

Pancreas: There is no pancreatic mass or inflammatory focus.

Spleen: No splenic lesions are evident.

Adrenals/Urinary Tract: Adrenals bilaterally appear normal. No
appreciable renal masses. There is moderate hydronephrosis on the
right. There is no appreciable hydronephrosis on the left. On the
left, there is a calculus in a midpole calyx extending into the
renal pelvis on the left measuring 1.9 x 1.1 cm. There are nearby
1-2 mm calculi in the mid left kidney. There is a calculus in the
lower pole of the left kidney measuring 1.0 x 0.8 cm. There is a 1
mm calculus at the left ureterovesical junction. On the right, there
is a calculus in the lower pole region measuring 5 4 mm. There is a
nearby 2 mm calculus in this area. There is a calculus in the right
renal pelvis extending to immediately superior to the ureteropelvic
junction on the right measuring 1.3 x 0.8 cm. The within the renal
pelvis there is also a 6 x 6 mm calculus with an adjacent 2 x 1 mm
calculus. More distally in the right ureter at the L3-4 level, there
is a 4 x 3 mm calculus. Urinary bladder is midline with wall
thickness within normal limits.

Stomach/Bowel: There is no appreciable bowel wall or mesenteric
thickening. There is moderate stool in the colon. No evident bowel
obstruction. No free air or portal venous air. Terminal ileum
appears normal. There is fatty infiltration in the ileocecal valve.
Appendix appears normal.

Vascular/Lymphatic: No abdominal aortic aneurysm. There are
scattered foci of aortic and iliac artery atherosclerosis. No
adenopathy is evident in the abdomen or pelvis.

Reproductive: Uterus is anteverted. A tiny calcification is noted in
the anterior uterus, a probable tiny leiomyoma. No adnexal masses
are evident.

Other: No abscess or ascites evident in the abdomen or pelvis. There
is mild fat in the umbilicus.

Musculoskeletal: There is degenerative change in the lower thoracic
spine. No blastic or lytic bone lesions. No intramuscular lesions
evident.
IMPRESSION: 1. On the left, there is a calculus in a midpole calyx extending
into the left renal pelvis measuring 1.9 x 1.1 cm. Several smaller
calculi noted in the left kidney. There is a 1 mm calculus at the
left ureterovesical junction. No appreciable hydronephrosis on the
left.

2. On the right, there are calculi in an extrarenal pelvis on the
right, largest measuring 1.3 x 0.8 cm. There is a 4 x 3 mm calculus
at the L3-4 level in the right ureter. There is moderate
hydronephrosis on the right.

3. No bowel wall thickening or bowel obstruction. No abscess in the
abdomen or pelvis. Appendix appears normal.

4.  Moderate hiatal hernia.

5.  Aortic Atherosclerosis ([id number]).

## 2022-10-06 IMAGING — DX DG CHEST 1V PORT
1 series · 1 of 1 positions shown · non-contrast
Comparison: 12/13/2017

CLINICAL DATA: Shortness of breath.  Near syncope.

EXAM:
PORTABLE CHEST 1 VIEW

[chest]
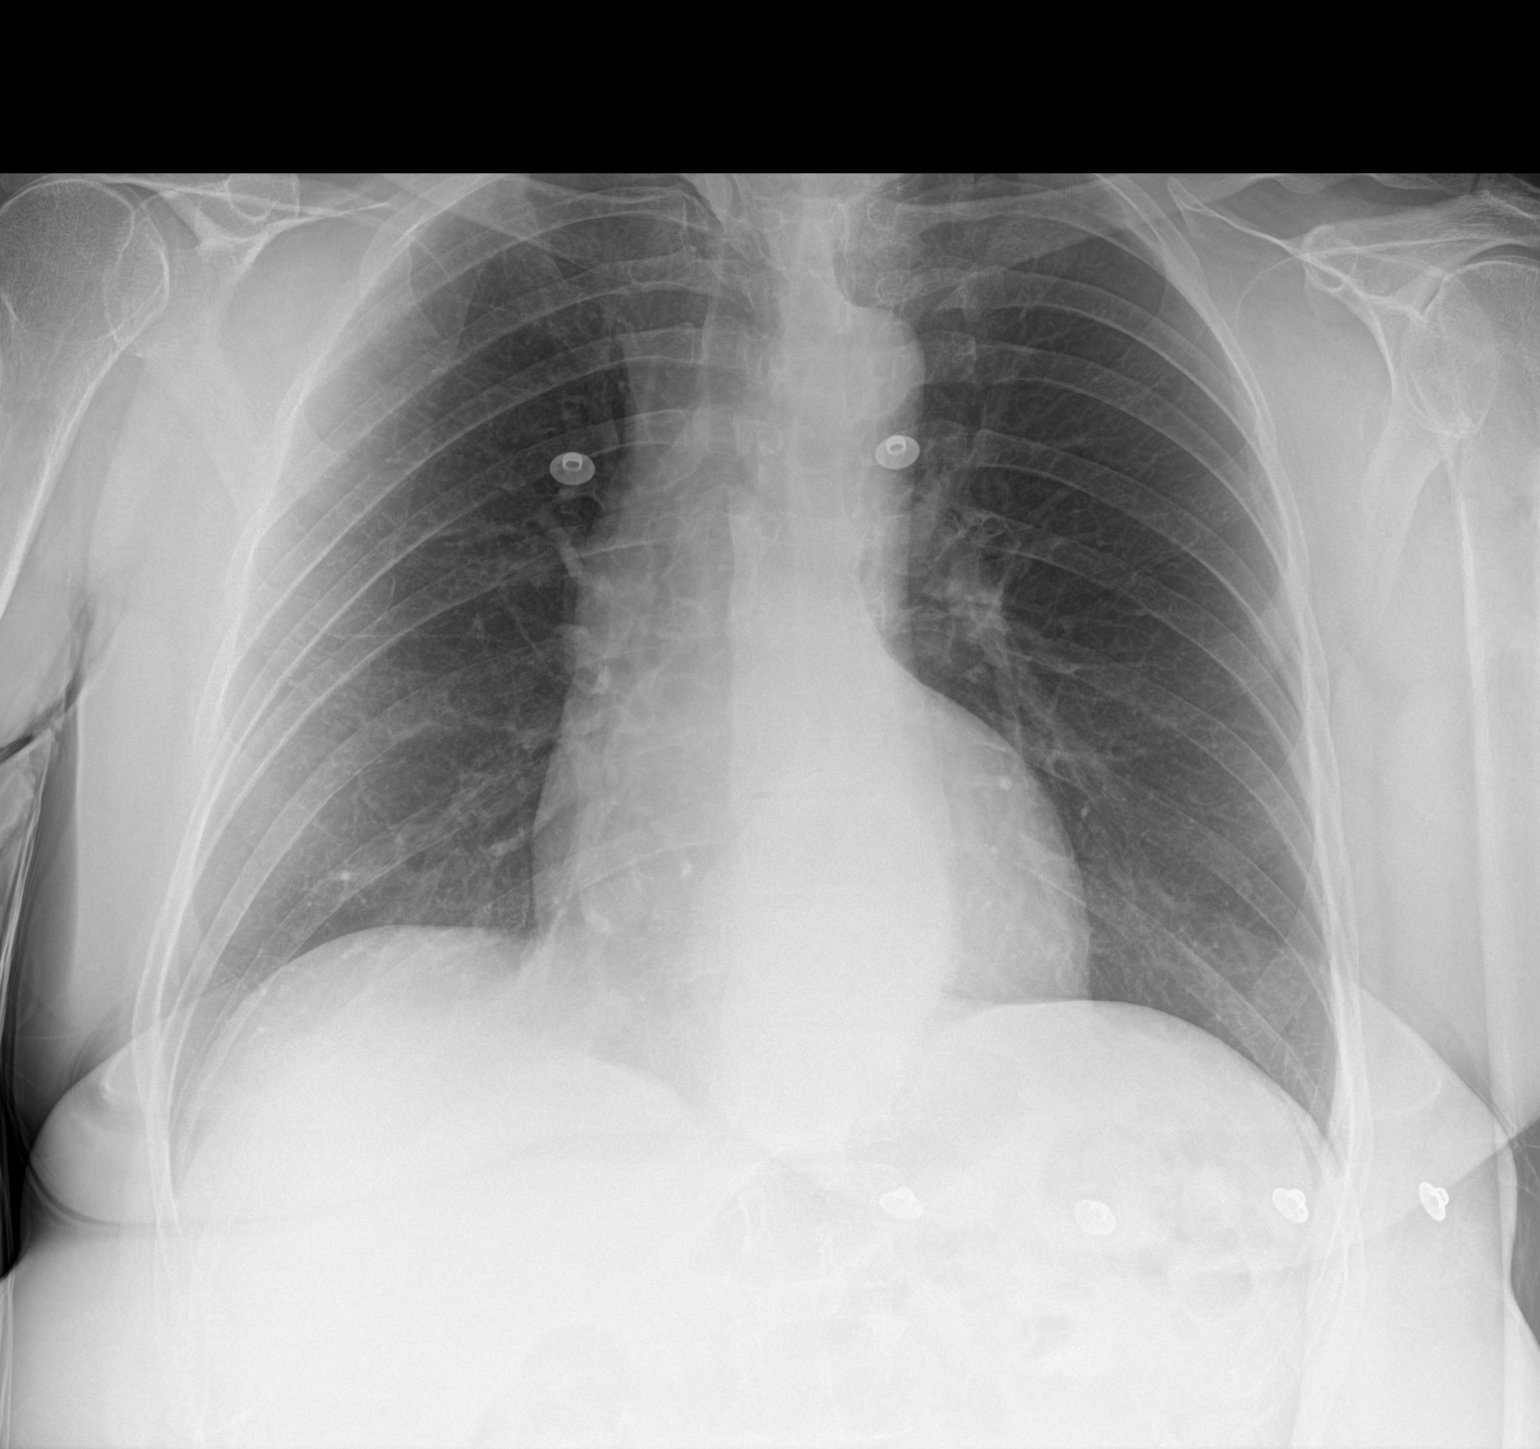

[1 of 1 positions shown; findings below may reference images not displayed]

FINDINGS: 3089 hours. The lungs are clear without focal pneumonia, edema,
pneumothorax or pleural effusion. The cardiopericardial silhouette
is within normal limits for size. Small hiatal hernia as seen on
recent CT stone study. The visualized bony structures of the thorax
show no acute abnormality.
IMPRESSION: No active disease.

Small hiatal hernia.

## 2023-09-03 ENCOUNTER — Emergency Department (HOSPITAL_COMMUNITY): Admission: EM | Admit: 2023-09-03 | Discharge: 2023-09-04 | Discharge status: Against Medical Advice | Payer: Self-pay

## 2023-09-03 NOTE — ED Notes (Signed)
 Pt called multiple for triage, pt did not reply.

## 2024-01-04 ENCOUNTER — Other Ambulatory Visit (HOSPITAL_COMMUNITY): Payer: Self-pay
